# Patient Record
Sex: Female | Born: 1975 | Race: Black or African American | Hispanic: No | Marital: Single | Smoking: Current every day smoker
Health system: Southern US, Community
[De-identification: ages and names within clinical notes are randomized; demographics above are authoritative.]

## PROBLEM LIST (undated history)

## (undated) DIAGNOSIS — R102 Pelvic and perineal pain: Secondary | ICD-10-CM

## (undated) DIAGNOSIS — F1290 Cannabis use, unspecified, uncomplicated: Secondary | ICD-10-CM

## (undated) DIAGNOSIS — C8590 Non-Hodgkin lymphoma, unspecified, unspecified site: Secondary | ICD-10-CM

## (undated) DIAGNOSIS — D219 Benign neoplasm of connective and other soft tissue, unspecified: Secondary | ICD-10-CM

## (undated) DIAGNOSIS — K432 Incisional hernia without obstruction or gangrene: Secondary | ICD-10-CM

## (undated) DIAGNOSIS — F319 Bipolar disorder, unspecified: Secondary | ICD-10-CM

## (undated) DIAGNOSIS — M2550 Pain in unspecified joint: Secondary | ICD-10-CM

## (undated) DIAGNOSIS — M7989 Other specified soft tissue disorders: Secondary | ICD-10-CM

## (undated) DIAGNOSIS — O009 Unspecified ectopic pregnancy without intrauterine pregnancy: Secondary | ICD-10-CM

## (undated) DIAGNOSIS — Z8759 Personal history of other complications of pregnancy, childbirth and the puerperium: Secondary | ICD-10-CM

## (undated) HISTORY — DX: Pain in unspecified joint: M25.50

## (undated) HISTORY — DX: Other specified soft tissue disorders: M79.89

## (undated) HISTORY — DX: Pelvic and perineal pain: R10.2

## (undated) HISTORY — DX: Cannabis use, unspecified, uncomplicated: F12.90

## (undated) HISTORY — DX: Personal history of other complications of pregnancy, childbirth and the puerperium: Z87.59

## (undated) HISTORY — DX: Incisional hernia without obstruction or gangrene: K43.2

## (undated) HISTORY — DX: Non-Hodgkin lymphoma, unspecified, unspecified site: C85.90

## (undated) HISTORY — DX: Bipolar disorder, unspecified: F31.9

---

## 1998-01-14 HISTORY — PX: EXPLORATORY LAPAROTOMY: SUR591

## 2000-02-05 ENCOUNTER — Inpatient Hospital Stay (HOSPITAL_COMMUNITY): Admission: AD | Admit: 2000-02-05 | Discharge: 2000-02-08 | Payer: Self-pay | Admitting: *Deleted

## 2000-02-07 ENCOUNTER — Encounter: Payer: Self-pay | Admitting: Obstetrics & Gynecology

## 2003-09-20 ENCOUNTER — Other Ambulatory Visit: Payer: Self-pay

## 2003-11-08 ENCOUNTER — Emergency Department: Payer: Self-pay | Admitting: Emergency Medicine

## 2004-08-05 ENCOUNTER — Emergency Department: Payer: Self-pay | Admitting: Emergency Medicine

## 2004-08-06 ENCOUNTER — Ambulatory Visit: Payer: Self-pay | Admitting: Emergency Medicine

## 2004-08-22 ENCOUNTER — Emergency Department: Payer: Self-pay | Admitting: Emergency Medicine

## 2004-10-18 ENCOUNTER — Emergency Department: Payer: Self-pay | Admitting: Emergency Medicine

## 2004-11-29 ENCOUNTER — Observation Stay: Payer: Self-pay

## 2005-01-30 ENCOUNTER — Observation Stay: Payer: Self-pay | Admitting: Obstetrics & Gynecology

## 2005-02-01 ENCOUNTER — Ambulatory Visit: Payer: Self-pay

## 2005-02-03 ENCOUNTER — Observation Stay: Payer: Self-pay | Admitting: Obstetrics & Gynecology

## 2005-02-04 ENCOUNTER — Observation Stay: Payer: Self-pay

## 2005-02-07 ENCOUNTER — Observation Stay: Payer: Self-pay | Admitting: Obstetrics and Gynecology

## 2005-02-08 ENCOUNTER — Inpatient Hospital Stay: Payer: Self-pay | Admitting: Unknown Physician Specialty

## 2005-02-12 ENCOUNTER — Inpatient Hospital Stay: Payer: Self-pay | Admitting: Obstetrics & Gynecology

## 2006-01-14 DIAGNOSIS — C8590 Non-Hodgkin lymphoma, unspecified, unspecified site: Secondary | ICD-10-CM

## 2006-01-14 HISTORY — PX: PORTACATH PLACEMENT: SHX2246

## 2006-01-14 HISTORY — DX: Non-Hodgkin lymphoma, unspecified, unspecified site: C85.90

## 2006-12-16 ENCOUNTER — Emergency Department: Payer: Self-pay | Admitting: Emergency Medicine

## 2007-03-28 ENCOUNTER — Emergency Department: Payer: Self-pay | Admitting: Emergency Medicine

## 2007-05-26 ENCOUNTER — Emergency Department: Payer: Self-pay | Admitting: Emergency Medicine

## 2007-07-04 ENCOUNTER — Emergency Department: Payer: Self-pay | Admitting: Emergency Medicine

## 2007-07-06 ENCOUNTER — Emergency Department: Payer: Self-pay | Admitting: Emergency Medicine

## 2007-07-08 ENCOUNTER — Emergency Department: Payer: Self-pay | Admitting: Emergency Medicine

## 2007-08-04 ENCOUNTER — Emergency Department: Payer: Self-pay | Admitting: Emergency Medicine

## 2007-08-08 ENCOUNTER — Ambulatory Visit: Payer: Self-pay | Admitting: Emergency Medicine

## 2007-08-14 ENCOUNTER — Ambulatory Visit: Payer: Self-pay

## 2007-11-22 ENCOUNTER — Emergency Department: Payer: Self-pay | Admitting: Emergency Medicine

## 2007-11-27 ENCOUNTER — Emergency Department: Payer: Self-pay | Admitting: Emergency Medicine

## 2007-12-29 ENCOUNTER — Emergency Department: Payer: Self-pay | Admitting: Emergency Medicine

## 2008-01-11 ENCOUNTER — Emergency Department: Payer: Self-pay | Admitting: Emergency Medicine

## 2008-02-22 ENCOUNTER — Encounter: Payer: Self-pay | Admitting: Obstetrics and Gynecology

## 2008-03-20 ENCOUNTER — Emergency Department: Payer: Self-pay | Admitting: Emergency Medicine

## 2008-05-26 ENCOUNTER — Ambulatory Visit: Payer: Self-pay | Admitting: Unknown Physician Specialty

## 2008-06-06 ENCOUNTER — Observation Stay: Payer: Self-pay

## 2008-06-07 ENCOUNTER — Observation Stay: Payer: Self-pay | Admitting: Obstetrics & Gynecology

## 2008-07-01 ENCOUNTER — Observation Stay: Payer: Self-pay

## 2008-07-04 ENCOUNTER — Observation Stay: Payer: Self-pay

## 2008-07-06 ENCOUNTER — Inpatient Hospital Stay: Payer: Self-pay | Admitting: Obstetrics and Gynecology

## 2009-01-14 HISTORY — PX: ABDOMINAL SURGERY: SHX537

## 2009-04-05 ENCOUNTER — Emergency Department: Payer: Self-pay

## 2009-07-11 ENCOUNTER — Observation Stay: Payer: Self-pay

## 2009-07-12 ENCOUNTER — Ambulatory Visit: Payer: Self-pay | Admitting: Obstetrics and Gynecology

## 2009-07-13 ENCOUNTER — Inpatient Hospital Stay: Payer: Self-pay

## 2010-06-01 NOTE — Discharge Summary (Signed)
[facility]  Patient:    Courtney Webb, Courtney Webb                        MRN: [id number] Adm. Date:  02/05/00 Disc. Date: 02/08/00 Attending:  Conni Elliot, M.D. Dictator:   Jamey Reas, M.D. CC:         [facility] Department; fax #[phone]                           Discharge Summary  DATE OF BIRTH:                April 27, 1975  SERVICE:                      [facility] teaching service.  PROCEDURE:                    None.  HOSPITAL COURSE:              A 35 year old G3, P1-0-1-1 at 74 and 5 by LMP confirmed by ultrasound who went to [facility] on February 05, 2000 with cramping and spotting.  Cervical examination revealed 1 cm external os.  [facility]  Patient had a history of preterm labor with her prior pregnancy, hospitalized x 2 months prior to delivery.  Patient was admitted.  Wet prep on admission showed positive clue cells per [place] with too numerous to count wbcs.  GC, chlamydia as well as group B strep were all done at the time of admission, all of which were subsequently found to be negative.  Patient was admitted, started on IV Unasyn as well as Motrin over the next 72 hours.  She did receive betamethasone 12.5 mg IM x 2 24 hours apart.  Patient remained stable throughout her hospitalization.  She was contracting no further.  Her examination was repeated on February 07, 2000 and found to have loose 1, 60-70%, -2 which was the same as on admission.  She did have a lot of lower uterine segment development.  Patient remained stable throughout the last 24 hours of her hospital course.  She will be discharged to home with followup at the [facility] Department scheduled for February 11, 2000 at 10:40 a.m. She will be discharged on prenatal vitamins.  She was discharged in good condition.  Patient was made aware of this plan.  She will be given preterm labor precautions prior  to discharge. DD:  02/08/00 TD:  02/08/00 Job: [id number]

## 2012-07-24 DIAGNOSIS — F1290 Cannabis use, unspecified, uncomplicated: Secondary | ICD-10-CM

## 2012-07-24 DIAGNOSIS — Z8759 Personal history of other complications of pregnancy, childbirth and the puerperium: Secondary | ICD-10-CM

## 2012-07-24 HISTORY — DX: Cannabis use, unspecified, uncomplicated: F12.90

## 2012-07-24 HISTORY — DX: Personal history of other complications of pregnancy, childbirth and the puerperium: Z87.59

## 2012-08-18 ENCOUNTER — Emergency Department: Payer: Self-pay | Admitting: Emergency Medicine

## 2012-08-18 LAB — WET PREP, GENITAL

## 2012-08-18 LAB — CBC
HCT: 34.2 % — ABNORMAL LOW (ref 35.0–47.0)
HGB: 12 g/dL (ref 12.0–16.0)
MCH: 32.9 pg (ref 26.0–34.0)
MCHC: 35.2 g/dL (ref 32.0–36.0)
MCV: 93 fL (ref 80–100)
Platelet: 236 10*3/uL (ref 150–440)
RBC: 3.66 10*6/uL — ABNORMAL LOW (ref 3.80–5.20)
RDW: 13.7 % (ref 11.5–14.5)
WBC: 9.8 10*3/uL (ref 3.6–11.0)

## 2012-08-18 LAB — LIPASE, BLOOD: Lipase: 179 U/L (ref 73–393)

## 2012-08-18 LAB — URINALYSIS, COMPLETE
Bacteria: NONE SEEN
Bilirubin,UR: NEGATIVE
Blood: NEGATIVE
Glucose,UR: NEGATIVE mg/dL (ref 0–75)
Ketone: NEGATIVE
Leukocyte Esterase: NEGATIVE
Nitrite: NEGATIVE
Ph: 7 (ref 4.5–8.0)
Protein: NEGATIVE
RBC,UR: 1 /HPF (ref 0–5)
Specific Gravity: 1.015 (ref 1.003–1.030)
Squamous Epithelial: 1
WBC UR: <1 /HPF (ref 0–5)

## 2012-08-18 LAB — GC/CHLAMYDIA PROBE AMP

## 2012-08-18 LAB — COMPREHENSIVE METABOLIC PANEL
Albumin: 3.2 g/dL — ABNORMAL LOW (ref 3.4–5.0)
Alkaline Phosphatase: 37 U/L — ABNORMAL LOW (ref 50–136)
Anion Gap: 3 — ABNORMAL LOW (ref 7–16)
BUN: 7 mg/dL (ref 7–18)
Bilirubin,Total: 0.3 mg/dL (ref 0.2–1.0)
Calcium, Total: 9 mg/dL (ref 8.5–10.1)
Chloride: 105 mmol/L (ref 98–107)
Co2: 29 mmol/L (ref 21–32)
Creatinine: 0.6 mg/dL (ref 0.60–1.30)
EGFR (African American): >60
EGFR (Non-African Amer.): >60
Glucose: 105 mg/dL — ABNORMAL HIGH (ref 65–99)
Osmolality: 272 (ref 275–301)
Potassium: 3.6 mmol/L (ref 3.5–5.1)
SGOT(AST): 12 U/L — ABNORMAL LOW (ref 15–37)
SGPT (ALT): 13 U/L (ref 12–78)
Sodium: 137 mmol/L (ref 136–145)
Total Protein: 7.4 g/dL (ref 6.4–8.2)

## 2012-08-18 LAB — HCG, QUANTITATIVE, PREGNANCY: Beta Hcg, Quant.: 27165 m[IU]/mL — ABNORMAL HIGH

## 2013-01-01 DIAGNOSIS — R102 Pelvic and perineal pain: Secondary | ICD-10-CM

## 2013-01-01 HISTORY — DX: Pelvic and perineal pain: R10.2

## 2013-12-02 DIAGNOSIS — M2550 Pain in unspecified joint: Secondary | ICD-10-CM

## 2013-12-02 HISTORY — DX: Pain in unspecified joint: M25.50

## 2013-12-13 DIAGNOSIS — C8590 Non-Hodgkin lymphoma, unspecified, unspecified site: Secondary | ICD-10-CM | POA: Insufficient documentation

## 2013-12-13 HISTORY — DX: Non-Hodgkin lymphoma, unspecified, unspecified site: C85.90

## 2014-05-27 ENCOUNTER — Other Ambulatory Visit: Payer: Self-pay

## 2014-05-27 ENCOUNTER — Emergency Department: Payer: Medicaid Other

## 2014-05-27 ENCOUNTER — Encounter: Payer: Self-pay | Admitting: Emergency Medicine

## 2014-05-27 ENCOUNTER — Emergency Department
Admission: EM | Admit: 2014-05-27 | Discharge: 2014-05-27 | Disposition: A | Discharge status: Home / Self Care | Payer: Medicaid Other | Attending: Emergency Medicine | Admitting: Emergency Medicine

## 2014-05-27 DIAGNOSIS — B9689 Other specified bacterial agents as the cause of diseases classified elsewhere: Secondary | ICD-10-CM

## 2014-05-27 DIAGNOSIS — N730 Acute parametritis and pelvic cellulitis: Secondary | ICD-10-CM | POA: Diagnosis not present

## 2014-05-27 DIAGNOSIS — N760 Acute vaginitis: Secondary | ICD-10-CM | POA: Diagnosis not present

## 2014-05-27 DIAGNOSIS — Z9889 Other specified postprocedural states: Secondary | ICD-10-CM | POA: Diagnosis not present

## 2014-05-27 DIAGNOSIS — Z3202 Encounter for pregnancy test, result negative: Secondary | ICD-10-CM | POA: Insufficient documentation

## 2014-05-27 DIAGNOSIS — N8320 Unspecified ovarian cysts: Secondary | ICD-10-CM | POA: Insufficient documentation

## 2014-05-27 DIAGNOSIS — R1032 Left lower quadrant pain: Secondary | ICD-10-CM | POA: Diagnosis present

## 2014-05-27 DIAGNOSIS — Z720 Tobacco use: Secondary | ICD-10-CM | POA: Insufficient documentation

## 2014-05-27 DIAGNOSIS — N83202 Unspecified ovarian cyst, left side: Secondary | ICD-10-CM

## 2014-05-27 DIAGNOSIS — R52 Pain, unspecified: Secondary | ICD-10-CM

## 2014-05-27 HISTORY — DX: Non-Hodgkin lymphoma, unspecified, unspecified site: C85.90

## 2014-05-27 LAB — CBC WITH DIFFERENTIAL/PLATELET
BASOS ABS: 0.1 10*3/uL (ref 0–0.1)
Basophils Relative: 1 %
Eosinophils Absolute: 0 10*3/uL (ref 0–0.7)
Eosinophils Relative: 0 %
HEMATOCRIT: 41.6 % (ref 35.0–47.0)
HEMOGLOBIN: 13.6 g/dL (ref 12.0–16.0)
LYMPHS PCT: 6 %
Lymphs Abs: 0.9 10*3/uL — ABNORMAL LOW (ref 1.0–3.6)
MCH: 30.2 pg (ref 26.0–34.0)
MCHC: 32.8 g/dL (ref 32.0–36.0)
MCV: 92.1 fL (ref 80.0–100.0)
Monocytes Absolute: 1 10*3/uL — ABNORMAL HIGH (ref 0.2–0.9)
Monocytes Relative: 6 %
NEUTROS ABS: 14.7 10*3/uL — AB (ref 1.4–6.5)
NEUTROS PCT: 87 %
Platelets: 172 10*3/uL (ref 150–440)
RBC: 4.52 MIL/uL (ref 3.80–5.20)
RDW: 14.5 % (ref 11.5–14.5)
WBC: 16.7 10*3/uL — AB (ref 3.6–11.0)

## 2014-05-27 LAB — URINALYSIS COMPLETE WITH MICROSCOPIC (ARMC ONLY)
Bilirubin Urine: NEGATIVE
Glucose, UA: NEGATIVE mg/dL
Leukocytes, UA: NEGATIVE
NITRITE: NEGATIVE
Protein, ur: NEGATIVE mg/dL
Specific Gravity, Urine: 1.021 (ref 1.005–1.030)
pH: 5 (ref 5.0–8.0)

## 2014-05-27 LAB — COMPREHENSIVE METABOLIC PANEL
ALT: 10 U/L — ABNORMAL LOW (ref 14–54)
AST: 14 U/L — ABNORMAL LOW (ref 15–41)
Albumin: 4 g/dL (ref 3.5–5.0)
Alkaline Phosphatase: 42 U/L (ref 38–126)
Anion gap: 10 (ref 5–15)
BUN: 9 mg/dL (ref 6–20)
CO2: 22 mmol/L (ref 22–32)
Calcium: 9.1 mg/dL (ref 8.9–10.3)
Chloride: 101 mmol/L (ref 101–111)
Creatinine, Ser: 0.83 mg/dL (ref 0.44–1.00)
GFR calc Af Amer: >60 mL/min (ref >60)
GFR calc non Af Amer: >60 mL/min (ref >60)
GLUCOSE: 105 mg/dL — AB (ref 65–99)
Potassium: 3.6 mmol/L (ref 3.5–5.1)
Sodium: 133 mmol/L — ABNORMAL LOW (ref 135–145)
Total Bilirubin: 0.7 mg/dL (ref 0.3–1.2)
Total Protein: 8.3 g/dL — ABNORMAL HIGH (ref 6.5–8.1)

## 2014-05-27 LAB — WET PREP, GENITAL
Trich, Wet Prep: NONE SEEN
YEAST WET PREP: NONE SEEN

## 2014-05-27 LAB — CHLAMYDIA/NGC RT PCR (ARMC ONLY)
Chlamydia Tr: NOT DETECTED
N GONORRHOEAE: NOT DETECTED

## 2014-05-27 LAB — LIPASE, BLOOD: LIPASE: 30 U/L (ref 22–51)

## 2014-05-27 LAB — POCT PREGNANCY, URINE: PREG TEST UR: NEGATIVE

## 2014-05-27 MED ORDER — FENTANYL CITRATE (PF) 100 MCG/2ML IJ SOLN
INTRAMUSCULAR | Status: AC
  Filled 2014-05-27: qty 2

## 2014-05-27 MED ORDER — IBUPROFEN 600 MG PO TABS: 600.0000 mg | ORAL_TABLET | Freq: Four times a day (QID) | ORAL | Status: AC | PRN

## 2014-05-27 MED ORDER — HYDROCODONE-ACETAMINOPHEN 5-325 MG PO TABS: 1.0000 | ORAL_TABLET | ORAL | Status: DC | PRN

## 2014-05-27 MED ORDER — DEXTROSE 5 % IV SOLN
INTRAVENOUS | Status: AC
  Administered 2014-05-27: 1 g via INTRAVENOUS
  Filled 2014-05-27: qty 10

## 2014-05-27 MED ORDER — DOXYCYCLINE HYCLATE 100 MG PO TABS: 100.0000 mg | ORAL_TABLET | Freq: Two times a day (BID) | ORAL | Status: DC

## 2014-05-27 MED ORDER — MORPHINE SULFATE 4 MG/ML IJ SOLN
INTRAMUSCULAR | Status: AC
  Administered 2014-05-27: 4 mg via INTRAVENOUS
  Filled 2014-05-27: qty 1

## 2014-05-27 MED ORDER — DEXTROSE 5 % IV SOLN
1.0000 g | Freq: Once | INTRAVENOUS | Status: AC
  Administered 2014-05-27: 1 g via INTRAVENOUS

## 2014-05-27 MED ORDER — SODIUM CHLORIDE 0.9 % IV BOLUS (SEPSIS)
1000.0000 mL | Freq: Once | INTRAVENOUS | Status: AC
  Administered 2014-05-27: 1000 mL via INTRAVENOUS

## 2014-05-27 MED ORDER — DOXYCYCLINE HYCLATE 100 MG PO TABS
100.0000 mg | ORAL_TABLET | Freq: Once | ORAL | Status: AC
  Administered 2014-05-27: 100 mg via ORAL
  Filled 2014-05-27 (×2): qty 1

## 2014-05-27 MED ORDER — MORPHINE SULFATE 4 MG/ML IJ SOLN
4.0000 mg | Freq: Once | INTRAMUSCULAR | Status: AC
  Administered 2014-05-27: 4 mg via INTRAVENOUS

## 2014-05-27 MED ORDER — ONDANSETRON HCL 4 MG/2ML IJ SOLN
INTRAMUSCULAR | Status: AC
  Administered 2014-05-27: 4 mg via INTRAVENOUS
  Filled 2014-05-27: qty 2

## 2014-05-27 MED ORDER — ONDANSETRON HCL 4 MG/2ML IJ SOLN
4.0000 mg | Freq: Once | INTRAMUSCULAR | Status: AC
  Administered 2014-05-27: 4 mg via INTRAVENOUS

## 2014-05-27 MED ORDER — FENTANYL CITRATE (PF) 100 MCG/2ML IJ SOLN
50.0000 ug | Freq: Once | INTRAMUSCULAR | Status: AC
  Administered 2014-05-27: 50 ug via INTRAVENOUS

## 2014-05-27 NOTE — ED Provider Notes (Signed)
[facility] Emergency Department Provider Note  Time seen: 11:05 AM  I have reviewed the triage vital signs and the nursing notes.   HISTORY  Chief Complaint Abdominal Pain    HPI Courtney Webb is a 39 y.o. female with a past medical history of non-Hodgkin's lymphoma in remission, who presents the emergency department with 2 days of left-sided abdominal pain and nausea. Patient also states 2-3 weeks of thick white vaginal discharge and pain with intercourse. Patient feels like she has had a fever since yesterday. Positive nausea, but denies vomiting. Denies diarrhea but states a normal bowel movement yesterday. Denies dysuria but states a foul smell to her urine. Patient describes the pain as a dull constant 10/10 pain mostly in the left lower quadrant but somewhat over the entire lower abdomen.    Past Medical History  Diagnosis Date  . Non Hodgkin's lymphoma 2008    There are no active problems to display for this patient.   Past Surgical History  Procedure Laterality Date  . Abdominal surgery    . Portacath placement      No current outpatient prescriptions on file.  Allergies Review of patient's allergies indicates no known allergies.  No family history on file.  Social History History  Substance Use Topics  . Smoking status: Current Every Day Smoker  . Smokeless tobacco: Not on file  . Alcohol Use: No    Review of Systems Constitutional: Positive for subjective fever ENT: Negative for cough or congestion. Cardiovascular: Negative for chest pain. Respiratory: Negative for shortness of breath. Gastrointestinal: Positive for left-sided abdominal pain. Negative for vomiting or diarrhea. Positive for nausea. Genitourinary: Negative for dysuria. Positive for foul urine odor. Musculoskeletal: Negative for back pain.  10-point ROS otherwise negative.  ____________________________________________   PHYSICAL EXAM:  VITAL SIGNS: ED  Triage Vitals  Enc Vitals Group     BP 05/27/14 0936 146/98 mmHg     Pulse Rate 05/27/14 0936 97     Resp 05/27/14 0936 20     Temp 05/27/14 0936 100.2 F (37.9 C)     Temp Source 05/27/14 0936 Oral     SpO2 05/27/14 0936 98 %     Weight 05/27/14 0936 156 lb (70.761 kg)     Height 05/27/14 0936 5\' 3"  (1.6 m)     Head Cir --      Peak Flow --      Pain Score --      Pain Loc --      Pain Edu? --      Excl. in [place]? --     Constitutional: Alert and oriented. Well appearing and in no distress. Eyes: Normal exam Cardiovascular: Normal rate, regular rhythm. No murmurs, rubs, or gallops. Respiratory: Normal respiratory effort without tachypnea nor retractions. Breath sounds are clear and equal bilaterally. No wheezes/rales/rhonchi. Gastrointestinal: Soft, mild/moderate left lower quadrant tenderness to palpation, mild suprapubic tenderness to palpation. No rebound or guarding. Genitourinary: Moderate white/thick discharge.  Moderate left adnexal Tenderness.  Moderate Cervical motion tenderness. Musculoskeletal: Nontender with normal range of motion in all extremities Neurologic:  Normal speech and language.  Skin:  Skin is warm, dry and intact.  Psychiatric: Mood and affect are normal. Speech and behavior are normal.   ____________________________________________    EKG  EKG shows normal sinus rhythm at 99 bpm, narrow QRS, normal axis, normal intervals, no ST changes.  ____________________________________________   INITIAL IMPRESSION / ASSESSMENT AND PLAN / ED COURSE  Pertinent labs &  imaging results that were available during my care of the patient were reviewed by me and considered in my medical decision making (see chart for details).  Patient has a borderline temperature of 100.2. Along with white vaginal discharge in left lower quadrant/pelvic tenderness, I will perform a pelvic exam to further evaluate. We'll check blood work and urinalysis. We'll treat pain and nausea, and  closely monitor in the emergency department.  ----------------------------------------- 2:52 PM on 05/27/2014 -----------------------------------------  CT shows left ovarian cyst approximate 4.5 cm. Ultrasound shows 2 ovarian cysts possibly complex. I discussed these findings with the on-call gynecologist Dr. Enzo Bi, who recommends doxycycline, pain medication and follow-up in the office on Tuesday with him. I discussed this with the patient along with very strict return precautions and she is agreeable to this plan.  ____________________________________________   FINAL CLINICAL IMPRESSION(S) / ED DIAGNOSES  Abdominal pain Ovarian Cysts Pelvic Inflammatory disease Bacterial vaginosis.   Harvest Dark, MD 05/27/14 [phone]

## 2014-05-27 NOTE — ED Notes (Signed)
Pt returned from US

## 2014-05-27 NOTE — Discharge Instructions (Signed)
Abdominal Pain Many things can cause belly (abdominal) pain. Most times, the belly pain is not dangerous. Many cases of belly pain can be watched and treated at home. HOME CARE   Do not take medicines that help you go poop (laxatives) unless told to by your doctor.  Only take medicine as told by your doctor.  Eat or drink as told by your doctor. Your doctor will tell you if you should be on a special diet. GET HELP IF:  You do not know what is causing your belly pain.  You have belly pain while you are sick to your stomach (nauseous) or have runny poop (diarrhea).  You have pain while you pee or poop.  Your belly pain wakes you up at night.  You have belly pain that gets worse or better when you eat.  You have belly pain that gets worse when you eat fatty foods.  You have a fever. GET HELP RIGHT AWAY IF:   The pain does not go away within 2 hours.  You keep throwing up (vomiting).  The pain changes and is only in the right or left part of the belly.  You have bloody or tarry looking poop. MAKE SURE YOU:   Understand these instructions.  Will watch your condition.  Will get help right away if you are not doing well or get worse. Document Released: 06/19/2007 Document Revised: 01/05/2013 Document Reviewed: 09/09/2012 [facility] Patient Information 2015 [place], [place]. This information is not intended to replace advice given to you by your health care provider. Make sure you discuss any questions you have with your health care provider.  Pelvic Inflammatory Disease Pelvic inflammatory disease (PID) is an infection in some or all of the female organs. PID can be in the uterus, ovaries, fallopian tubes, or the surrounding tissues inside the lower belly area (pelvis). HOME CARE   If given, take your antibiotic medicine as told. Finish them even if you start to feel better.  Only take medicine as told by your doctor.  Do not have sex (intercourse) until treatment is done or  as told by your doctor.  Tell your sex partner if you have PID. Your partner may need to be treated.  Keep all doctor visits. GET HELP RIGHT AWAY IF:   You have a fever.  You have more belly (abdominal) or lower belly pain.  You have chills.  You have pain when you pee (urinate).  You are not better after 72 hours.  You have more fluid (discharge) coming from your vagina or fluid that is not normal.  You need pain medicine from your doctor.  You throw up (vomit).  You cannot take your medicines.  Your partner has a sexually transmitted disease (STD). MAKE SURE YOU:   Understand these instructions.  Will watch your condition.  Will get help right away if you are not doing well or get worse. Document Released: 03/29/2008 Document Revised: 04/27/2012 Document Reviewed: 12/27/2010 [facility] Patient Information 2015 [place], [place]. This information is not intended to replace advice given to you by your health care provider. Make sure you discuss any questions you have with your health care provider.    Please call the number provided for gynecology for follow-up on Tuesday as we discussed. Please return to the emergency department with any worsening pain, fever, vomiting. Take your medications as prescribed.

## 2014-05-27 NOTE — ED Notes (Signed)
Pt reports LLQ pain, radiating to her back Pt reports nausea denies vomiting.

## 2014-05-27 NOTE — ED Notes (Addendum)
Patient c/o sharp LLQ pain since yesterday. States that pain extends down her legs and up to her chest. Admits to some nausea. White, fishy-smelling vaginal discharge. Denies any vaginal itching. Patient tearful and fidgety in triage.

## 2014-05-27 NOTE — ED Notes (Signed)
Pt transported to US

## 2014-05-29 ENCOUNTER — Emergency Department
Admission: EM | Admit: 2014-05-29 | Discharge: 2014-05-29 | Disposition: A | Discharge status: Home / Self Care | Payer: Medicaid Other | Attending: Emergency Medicine | Admitting: Emergency Medicine

## 2014-05-29 ENCOUNTER — Encounter: Payer: Self-pay | Admitting: Emergency Medicine

## 2014-05-29 DIAGNOSIS — R799 Abnormal finding of blood chemistry, unspecified: Secondary | ICD-10-CM | POA: Diagnosis present

## 2014-05-29 HISTORY — DX: Unspecified ectopic pregnancy without intrauterine pregnancy: O00.90

## 2014-05-29 NOTE — ED Notes (Signed)
Spoke with MD Edd Fabian, stated Beta was done 2 years ago. Spoke with pt, stated she would f/u with GYN. Pt voiced no concerns or questions.

## 2014-05-29 NOTE — ED Notes (Signed)
Pt presents to ER stating she was seen yesterday. Pt reports she was d/c and told to f/u with GYN for PID. Pt has been taking antibiotics, but reports when she check her MyChart saw her HCG level was raised. Pt had CT and US done 2 days ago that were neg for pregnancy.

## 2014-12-11 ENCOUNTER — Encounter: Payer: Self-pay | Admitting: Emergency Medicine

## 2014-12-11 ENCOUNTER — Emergency Department: Payer: Medicaid Other

## 2014-12-11 ENCOUNTER — Emergency Department
Admission: EM | Admit: 2014-12-11 | Discharge: 2014-12-11 | Disposition: A | Discharge status: Home / Self Care | Payer: Medicaid Other | Attending: Emergency Medicine | Admitting: Emergency Medicine

## 2014-12-11 DIAGNOSIS — N83201 Unspecified ovarian cyst, right side: Secondary | ICD-10-CM | POA: Diagnosis not present

## 2014-12-11 DIAGNOSIS — F17200 Nicotine dependence, unspecified, uncomplicated: Secondary | ICD-10-CM | POA: Diagnosis not present

## 2014-12-11 DIAGNOSIS — R1031 Right lower quadrant pain: Secondary | ICD-10-CM | POA: Diagnosis present

## 2014-12-11 DIAGNOSIS — N760 Acute vaginitis: Secondary | ICD-10-CM | POA: Insufficient documentation

## 2014-12-11 DIAGNOSIS — B9689 Other specified bacterial agents as the cause of diseases classified elsewhere: Secondary | ICD-10-CM

## 2014-12-11 DIAGNOSIS — R102 Pelvic and perineal pain: Secondary | ICD-10-CM

## 2014-12-11 DIAGNOSIS — Z3202 Encounter for pregnancy test, result negative: Secondary | ICD-10-CM | POA: Insufficient documentation

## 2014-12-11 DIAGNOSIS — Z792 Long term (current) use of antibiotics: Secondary | ICD-10-CM | POA: Diagnosis not present

## 2014-12-11 LAB — COMPREHENSIVE METABOLIC PANEL
ALBUMIN: 3.8 g/dL (ref 3.5–5.0)
ALT: 8 U/L — ABNORMAL LOW (ref 14–54)
ANION GAP: 4 — AB (ref 5–15)
AST: 14 U/L — ABNORMAL LOW (ref 15–41)
Alkaline Phosphatase: 36 U/L — ABNORMAL LOW (ref 38–126)
BILIRUBIN TOTAL: 0.4 mg/dL (ref 0.3–1.2)
BUN: 10 mg/dL (ref 6–20)
CO2: 28 mmol/L (ref 22–32)
Calcium: 8.9 mg/dL (ref 8.9–10.3)
Chloride: 106 mmol/L (ref 101–111)
Creatinine, Ser: 0.7 mg/dL (ref 0.44–1.00)
GFR calc Af Amer: >60 mL/min (ref >60)
Glucose, Bld: 95 mg/dL (ref 65–99)
POTASSIUM: 3.8 mmol/L (ref 3.5–5.1)
Sodium: 138 mmol/L (ref 135–145)
TOTAL PROTEIN: 7.7 g/dL (ref 6.5–8.1)

## 2014-12-11 LAB — URINALYSIS COMPLETE WITH MICROSCOPIC (ARMC ONLY)
Bacteria, UA: NONE SEEN
Bilirubin Urine: NEGATIVE
GLUCOSE, UA: NEGATIVE mg/dL
Hgb urine dipstick: NEGATIVE
KETONES UR: NEGATIVE mg/dL
Leukocytes, UA: NEGATIVE
NITRITE: NEGATIVE
PROTEIN: NEGATIVE mg/dL
SPECIFIC GRAVITY, URINE: 1.023 (ref 1.005–1.030)
pH: 7 (ref 5.0–8.0)

## 2014-12-11 LAB — CBC
HCT: 42.7 % (ref 35.0–47.0)
HEMOGLOBIN: 13.8 g/dL (ref 12.0–16.0)
MCH: 30.9 pg (ref 26.0–34.0)
MCHC: 32.4 g/dL (ref 32.0–36.0)
MCV: 95.6 fL (ref 80.0–100.0)
Platelets: 204 10*3/uL (ref 150–440)
RBC: 4.47 MIL/uL (ref 3.80–5.20)
RDW: 15 % — ABNORMAL HIGH (ref 11.5–14.5)
WBC: 6.3 10*3/uL (ref 3.6–11.0)

## 2014-12-11 LAB — WET PREP, GENITAL
Sperm: NONE SEEN
Trich, Wet Prep: NONE SEEN
YEAST WET PREP: NONE SEEN

## 2014-12-11 LAB — CHLAMYDIA/NGC RT PCR (ARMC ONLY)
Chlamydia Tr: NOT DETECTED
N GONORRHOEAE: NOT DETECTED

## 2014-12-11 LAB — LIPASE, BLOOD: Lipase: 44 U/L (ref 11–51)

## 2014-12-11 LAB — POCT PREGNANCY, URINE: Preg Test, Ur: NEGATIVE

## 2014-12-11 MED ORDER — PROMETHAZINE HCL 25 MG PO TABS: 25.0000 mg | ORAL_TABLET | Freq: Four times a day (QID) | ORAL | Status: DC | PRN

## 2014-12-11 MED ORDER — OXYCODONE-ACETAMINOPHEN 5-325 MG PO TABS: 1.0000 | ORAL_TABLET | Freq: Four times a day (QID) | ORAL | Status: DC | PRN

## 2014-12-11 MED ORDER — IOHEXOL 300 MG/ML  SOLN
75.0000 mL | Freq: Once | INTRAMUSCULAR | Status: AC | PRN
  Administered 2014-12-11: 75 mL via INTRAVENOUS

## 2014-12-11 MED ORDER — NAPROXEN 500 MG PO TABS: 500.0000 mg | ORAL_TABLET | Freq: Two times a day (BID) | ORAL | Status: DC

## 2014-12-11 MED ORDER — METRONIDAZOLE 500 MG PO TABS: 500.0000 mg | ORAL_TABLET | Freq: Three times a day (TID) | ORAL | Status: DC

## 2014-12-11 MED ORDER — IOHEXOL 240 MG/ML SOLN
25.0000 mL | Freq: Once | INTRAMUSCULAR | Status: AC | PRN
  Administered 2014-12-11: 25 mL via ORAL

## 2014-12-11 NOTE — ED Notes (Signed)
Pt presents to ER with right lower abdominal pain that started yesterday as cramping around umbilicus and woke up this am with worsening pain that has radiated to right lower quadrant " I can barely move". Pt describes pain as pulling and constant. Pt has taken ibuprofen (last night) only without relief. Pt states that "moving a lot" makes it worse. Alert and oriented with no acute distress.

## 2014-12-11 NOTE — Discharge Instructions (Signed)
You were prescribed a medication that is potentially sedating. Do not drink alcohol, drive or participate in any other potentially dangerous activities while taking this medication as it may make you sleepy. Do not take this medication with any other sedating medications, either prescription or over-the-counter. If you were prescribed Percocet or Vicodin, do not take these with acetaminophen (Tylenol) as it is already contained within these medications.   Opioid pain medications (or "narcotics") can be habit forming.  Use it as little as possible to achieve adequate pain control.  Do not use or use it with extreme caution if you have a history of opiate abuse or dependence.  If you are on a pain contract with your primary care doctor or a pain specialist, be sure to let them know you were prescribed this medication today from the [facility].  This medication is intended for your use only - do not give any to anyone else and keep it in a secure place where nobody else, especially children and pets, have access to it.  It will also cause or worsen constipation, so you may want to consider taking an over-the-counter stool softener while you are taking this medication.  Bacterial Vaginosis Bacterial vaginosis is a vaginal infection that occurs when the normal balance of bacteria in the vagina is disrupted. It results from an overgrowth of certain bacteria. This is the most common vaginal infection in women of childbearing age. Treatment is important to prevent complications, especially in pregnant women, as it can cause a premature delivery. CAUSES  Bacterial vaginosis is caused by an increase in harmful bacteria that are normally present in smaller amounts in the vagina. Several different kinds of bacteria can cause bacterial vaginosis. However, the reason that the condition develops is not fully understood. RISK FACTORS Certain activities or behaviors can put you at an increased  risk of developing bacterial vaginosis, including:  Having a new sex partner or multiple sex partners.  Douching.  Using an intrauterine device (IUD) for contraception. Women do not get bacterial vaginosis from toilet seats, bedding, swimming pools, or contact with objects around them. SIGNS AND SYMPTOMS  Some women with bacterial vaginosis have no signs or symptoms. Common symptoms include:  Grey vaginal discharge.  A fishlike odor with discharge, especially after sexual intercourse.  Itching or burning of the vagina and vulva.  Burning or pain with urination. DIAGNOSIS  Your health care provider will take a medical history and examine the vagina for signs of bacterial vaginosis. A sample of vaginal fluid may be taken. Your health care provider will look at this sample under a microscope to check for bacteria and abnormal cells. A vaginal pH test may also be done.  TREATMENT  Bacterial vaginosis may be treated with antibiotic medicines. These may be given in the form of a pill or a vaginal cream. A second round of antibiotics may be prescribed if the condition comes back after treatment. Because bacterial vaginosis increases your risk for sexually transmitted diseases, getting treated can help reduce your risk for chlamydia, gonorrhea, HIV, and herpes. HOME CARE INSTRUCTIONS   Only take over-the-counter or prescription medicines as directed by your health care provider.  If antibiotic medicine was prescribed, take it as directed. Make sure you finish it even if you start to feel better.  Tell all sexual partners that you have a vaginal infection. They should see their health care provider and be treated if they have problems, such as a mild rash or itching.  During treatment, it is important that you follow these instructions:  Avoid sexual activity or use condoms correctly.  Do not douche.  Avoid alcohol as directed by your health care provider.  Avoid breastfeeding as  directed by your health care provider. SEEK MEDICAL CARE IF:   Your symptoms are not improving after 3 days of treatment.  You have increased discharge or pain.  You have a fever. MAKE SURE YOU:   Understand these instructions.  Will watch your condition.  Will get help right away if you are not doing well or get worse. FOR MORE INFORMATION  Centers for Disease Control and Prevention, Division of STD Prevention: [employer] American Sexual Health Association (ASHA): www.ashastd.org    This information is not intended to replace advice given to you by your health care provider. Make sure you discuss any questions you have with your health care provider.   Document Released: 12/31/2004 Document Revised: 01/21/2014 Document Reviewed: 08/12/2012 Elsevier Interactive Patient Education 2016 [employer] Inc.  Pelvic Pain, Female Female pelvic pain can be caused by many different things and start from a variety of places. Pelvic pain refers to pain that is located in the lower half of the abdomen and between your hips. The pain may occur over a short period of time (acute) or may be reoccurring (chronic). The cause of pelvic pain may be related to disorders affecting the female reproductive organs (gynecologic), but it may also be related to the bladder, kidney stones, an intestinal complication, or muscle or skeletal problems. Getting help right away for pelvic pain is important, especially if there has been severe, sharp, or a sudden onset of unusual pain. It is also important to get help right away because some types of pelvic pain can be life threatening.  CAUSES  Below are only some of the causes of pelvic pain. The causes of pelvic pain can be in one of several categories.   Gynecologic.  Pelvic inflammatory disease.  Sexually transmitted infection.  Ovarian cyst or a twisted ovarian ligament (ovarian torsion).  Uterine lining that grows outside the uterus  (endometriosis).  Fibroids, cysts, or tumors.  Ovulation.  Pregnancy.  Pregnancy that occurs outside the uterus (ectopic pregnancy).  Miscarriage.  Labor.  Abruption of the placenta or ruptured uterus.  Infection.  Uterine infection (endometritis).  Bladder infection.  Diverticulitis.  Miscarriage related to a uterine infection (septic abortion).  Bladder.  Inflammation of the bladder (cystitis).  Kidney stone(s).  Gastrointestinal.  Constipation.  Diverticulitis.  Neurologic.  Trauma.  Feeling pelvic pain because of mental or emotional causes (psychosomatic).  Cancers of the bowel or pelvis. EVALUATION  Your caregiver will want to take a careful history of your concerns. This includes recent changes in your health, a careful gynecologic history of your periods (menses), and a sexual history. Obtaining your family history and medical history is also important. Your caregiver may suggest a pelvic exam. A pelvic exam will help identify the location and severity of the pain. It also helps in the evaluation of which organ system may be involved. In order to identify the cause of the pelvic pain and be properly treated, your caregiver may order tests. These tests may include:   A pregnancy test.  Pelvic ultrasonography.  An X-ray exam of the abdomen.  A urinalysis or evaluation of vaginal discharge.  Blood tests. HOME CARE INSTRUCTIONS   Only take over-the-counter or prescription medicines for pain, discomfort, or fever as directed by your caregiver.   Rest as directed by  your caregiver.   Eat a balanced diet.   Drink enough fluids to make your urine clear or pale yellow, or as directed.   Avoid sexual intercourse if it causes pain.   Apply warm or cold compresses to the lower abdomen depending on which one helps the pain.   Avoid stressful situations.   Keep a journal of your pelvic pain. Write down when it started, where the pain is  located, and if there are things that seem to be associated with the pain, such as food or your menstrual cycle.  Follow up with your caregiver as directed.  SEEK MEDICAL CARE IF:  Your medicine does not help your pain.  You have abnormal vaginal discharge. SEEK IMMEDIATE MEDICAL CARE IF:   You have heavy bleeding from the vagina.   Your pelvic pain increases.   You feel light-headed or faint.   You have chills.   You have pain with urination or blood in your urine.   You have uncontrolled diarrhea or vomiting.   You have a fever or persistent symptoms for more than 3 days.  You have a fever and your symptoms suddenly get worse.   You are being physically or sexually abused.   This information is not intended to replace advice given to you by your health care provider. Make sure you discuss any questions you have with your health care provider.   Document Released: 11/28/2003 Document Revised: 09/21/2014 Document Reviewed: 04/22/2011 Elsevier Interactive Patient Education [employer].

## 2014-12-11 NOTE — ED Notes (Signed)
Pt states upon return from ultrasound she is having some cramping and vaginal spotting.

## 2014-12-11 NOTE — ED Provider Notes (Signed)
[facility] Emergency Department Provider Note  ____________________________________________  Time seen: 1:10 PM  I have reviewed the triage vital signs and the nursing notes.   HISTORY  Chief Complaint Abdominal Pain    HPI Courtney Webb is a 39 y.o. female who presents with right lower quadrant pain. She reports she had abdominal pain around the umbilicus yesterday but today is localized to the right lower quadrant. She reports is painful to move. She denies nausea or vomiting. No fevers or chills. No diarrhea. No trauma to the area. She reports the pain is sharp and crampy and moderate to severe. Nothing seems to make it better except holding still     Past Medical History  Diagnosis Date  . Non Hodgkin's lymphoma ([place]) 2008  . Ectopic pregnancy   . Non Hodgkin's lymphoma ([place])     There are no active problems to display for this patient.   Past Surgical History  Procedure Laterality Date  . Abdominal surgery    . Portacath placement      Current Outpatient Rx  Name  Route  Sig  Dispense  Refill  . doxycycline (VIBRA-TABS) 100 MG tablet   Oral   Take 1 tablet (100 mg total) by mouth 2 (two) times daily.   20 tablet   0   . HYDROcodone-acetaminophen (NORCO/VICODIN) 5-325 MG per tablet   Oral   Take 1 tablet by mouth every 4 (four) hours as needed for moderate pain.   20 tablet   0   . ibuprofen (ADVIL,MOTRIN) 600 MG tablet   Oral   Take 1 tablet (600 mg total) by mouth every 6 (six) hours as needed for moderate pain.   100 tablet   2     Allergies Review of patient's allergies indicates no known allergies.  History reviewed. No pertinent family history.  Social History Social History  Substance Use Topics  . Smoking status: Current Every Day Smoker  . Smokeless tobacco: None  . Alcohol Use: No    Review of Systems  Constitutional: Negative for fever. Eyes: Negative for visual changes. ENT: Negative for sore  throat Cardiovascular: Negative for chest pain. Respiratory: Negative for shortness of breath. Gastrointestinal: Positive for abdominal pain Genitourinary: Negative for dysuria. Musculoskeletal: Negative for back pain. Skin: Negative for rash. Neurological: Negative for headaches or focal weakness Psychiatric: No anxiety    ____________________________________________   PHYSICAL EXAM:  VITAL SIGNS: ED Triage Vitals  Enc Vitals Group     BP 12/11/14 1115 142/95 mmHg     Pulse Rate 12/11/14 1115 77     Resp 12/11/14 1115 18     Temp 12/11/14 1115 97.6 F (36.4 C)     Temp Source 12/11/14 1115 Oral     SpO2 12/11/14 1115 99 %     Weight 12/11/14 1115 161 lb (73.029 kg)     Height 12/11/14 1115 5\' 3"  (1.6 m)     Head Cir --      Peak Flow --      Pain Score 12/11/14 1115 8     Pain Loc --      Pain Edu? --      Excl. in [place]? --      Constitutional: Alert and oriented. Well appearing and in no distress. Eyes: Conjunctivae are normal.  ENT   Head: Normocephalic and atraumatic.   Mouth/Throat: Mucous membranes are moist. Cardiovascular: Normal rate, regular rhythm. Normal and symmetric distal pulses are present in all extremities. No  murmurs, rubs, or gallops. Respiratory: Normal respiratory effort without tachypnea nor retractions. Breath sounds are clear and equal bilaterally.  Gastrointestinal: Mild right lower quadrant tenderness to palpation. No distention. There is no CVA tenderness. Genitourinary: white discharge on exam, no CMT, Musculoskeletal: Nontender with normal range of motion in all extremities. No lower extremity tenderness nor edema. Neurologic:  Normal speech and language. No gross focal neurologic deficits are appreciated. Skin:  Skin is warm, dry and intact. No rash noted. Psychiatric: Mood and affect are normal. Patient exhibits appropriate insight and judgment.  ____________________________________________    LABS (pertinent  positives/negatives)  Labs Reviewed  COMPREHENSIVE METABOLIC PANEL - Abnormal; Notable for the following:    AST 14 (*)    ALT 8 (*)    Alkaline Phosphatase 36 (*)    Anion gap 4 (*)    All other components within normal limits  CBC - Abnormal; Notable for the following:    RDW 15.0 (*)    All other components within normal limits  LIPASE, BLOOD  URINALYSIS COMPLETEWITH MICROSCOPIC ([facility] ONLY)  POC URINE PREG, ED    ____________________________________________   EKG  None  ____________________________________________    RADIOLOGY I have personally reviewed any xrays that were ordered on this patient: CT scan unremarkable  Ultrasound pending  ____________________________________________   PROCEDURES  Procedure(s) performed: none  Critical Care performed: none  ____________________________________________   INITIAL IMPRESSION / ASSESSMENT AND PLAN / ED COURSE  Pertinent labs & imaging results that were available during my care of the patient were reviewed by me and considered in my medical decision making (see chart for details).  Patient's labs are unremarkable, pregnancy is pending. Mild tenderness to palpation right lower quadrant, we'll obtain CT abdomen and pelvis to reevaluate  Pelvic exam with white discharge.  At this point I will sign out to Dr. Joni Fears to follow up on the wet prep, and pelvic ultrasound. If unremarkable patient will be appropriate for discharge  ____________________________________________   FINAL CLINICAL IMPRESSION(S) / ED DIAGNOSES  Final diagnoses:  Pelvic pain in female  Pelvic pain in female     Lavonia Drafts, MD 12/11/14 1540

## 2014-12-11 NOTE — ED Provider Notes (Signed)
-----------------------------------------   7:03 PM on 12/11/2014 -----------------------------------------  Ultrasound reveals no torsion, but complex right ovarian cyst possibly leaking. She also has bacterial vaginosis with clue cells on wet prep. We'll give patient Percocet and Phenergan naproxen and Flagyl. Counseled to follow up with GYN if her symptoms persist or return to emergency Department if she has sudden severe pelvic pain due to risk of torsion.  Carrie Mew, MD 12/11/14 [phone]

## 2014-12-11 NOTE — ED Notes (Signed)
Patient with no complaints at this time. Respirations even and unlabored. Skin warm/dry. Discharge instructions reviewed with patient at this time. Patient given opportunity to voice concerns/ask questions. IV removed per policy and band-aid applied to site. Patient discharged at this time and left Emergency Department with steady gait.  

## 2014-12-19 DIAGNOSIS — N938 Other specified abnormal uterine and vaginal bleeding: Secondary | ICD-10-CM | POA: Diagnosis not present

## 2014-12-19 DIAGNOSIS — D259 Leiomyoma of uterus, unspecified: Secondary | ICD-10-CM | POA: Diagnosis not present

## 2014-12-19 DIAGNOSIS — Z79899 Other long term (current) drug therapy: Secondary | ICD-10-CM | POA: Diagnosis not present

## 2014-12-19 DIAGNOSIS — Z792 Long term (current) use of antibiotics: Secondary | ICD-10-CM | POA: Insufficient documentation

## 2014-12-19 DIAGNOSIS — Z3202 Encounter for pregnancy test, result negative: Secondary | ICD-10-CM | POA: Insufficient documentation

## 2014-12-19 DIAGNOSIS — N939 Abnormal uterine and vaginal bleeding, unspecified: Secondary | ICD-10-CM | POA: Diagnosis present

## 2014-12-19 DIAGNOSIS — Z791 Long term (current) use of non-steroidal anti-inflammatories (NSAID): Secondary | ICD-10-CM | POA: Insufficient documentation

## 2014-12-19 DIAGNOSIS — F17200 Nicotine dependence, unspecified, uncomplicated: Secondary | ICD-10-CM | POA: Insufficient documentation

## 2014-12-19 LAB — COMPREHENSIVE METABOLIC PANEL
ALBUMIN: 3.9 g/dL (ref 3.5–5.0)
ALT: 8 U/L — ABNORMAL LOW (ref 14–54)
AST: 13 U/L — AB (ref 15–41)
Alkaline Phosphatase: 41 U/L (ref 38–126)
Anion gap: 5 (ref 5–15)
BILIRUBIN TOTAL: 0.2 mg/dL — AB (ref 0.3–1.2)
BUN: 10 mg/dL (ref 6–20)
CO2: 32 mmol/L (ref 22–32)
Calcium: 9.4 mg/dL (ref 8.9–10.3)
Chloride: 104 mmol/L (ref 101–111)
Creatinine, Ser: 0.64 mg/dL (ref 0.44–1.00)
GFR calc Af Amer: >60 mL/min (ref >60)
GFR calc non Af Amer: >60 mL/min (ref >60)
GLUCOSE: 89 mg/dL (ref 65–99)
POTASSIUM: 3.6 mmol/L (ref 3.5–5.1)
SODIUM: 141 mmol/L (ref 135–145)
TOTAL PROTEIN: 7.8 g/dL (ref 6.5–8.1)

## 2014-12-19 LAB — HEMOGLOBIN AND HEMATOCRIT, BLOOD
HCT: 37.5 % (ref 35.0–47.0)
Hemoglobin: 12.3 g/dL (ref 12.0–16.0)

## 2014-12-19 LAB — POCT PREGNANCY, URINE: PREG TEST UR: NEGATIVE

## 2014-12-19 NOTE — ED Notes (Signed)
Patient to ED for vaginal bleeding. Was seen here last Sunday for abdominal pain and states after the ultrasound she started having vaginal bleeding and has not stopped since. Is using two tampons per hour. No really cramping "Just a pulling sensation." Patient states she is feeling tired often.

## 2014-12-20 ENCOUNTER — Emergency Department
Admission: EM | Admit: 2014-12-20 | Discharge: 2014-12-20 | Disposition: A | Discharge status: Home / Self Care | Payer: Medicaid Other | Attending: Emergency Medicine | Admitting: Emergency Medicine

## 2014-12-20 ENCOUNTER — Emergency Department: Payer: Medicaid Other

## 2014-12-20 DIAGNOSIS — N938 Other specified abnormal uterine and vaginal bleeding: Secondary | ICD-10-CM

## 2014-12-20 DIAGNOSIS — D259 Leiomyoma of uterus, unspecified: Secondary | ICD-10-CM

## 2014-12-20 DIAGNOSIS — R102 Pelvic and perineal pain: Secondary | ICD-10-CM

## 2014-12-20 MED ORDER — OXYCODONE-ACETAMINOPHEN 5-325 MG PO TABS
1.0000 | ORAL_TABLET | Freq: Once | ORAL | Status: AC
  Administered 2014-12-20: 1 via ORAL
  Filled 2014-12-20: qty 1

## 2014-12-20 MED ORDER — KETOROLAC TROMETHAMINE 10 MG PO TABS
10.0000 mg | ORAL_TABLET | Freq: Once | ORAL | Status: AC
  Administered 2014-12-20: 10 mg via ORAL
  Filled 2014-12-20: qty 1

## 2014-12-20 NOTE — Discharge Instructions (Signed)
Abnormal Uterine Bleeding °Abnormal uterine bleeding can affect women at various stages in life, including teenagers, women in their reproductive years, pregnant women, and women who have reached menopause. Several kinds of uterine bleeding are considered abnormal, including: °· Bleeding or spotting between periods.   °· Bleeding after sexual intercourse.   °· Bleeding that is heavier or more than normal.   °· Periods that last longer than usual. °· Bleeding after menopause.   °Many cases of abnormal uterine bleeding are minor and simple to treat, while others are more serious. Any type of abnormal bleeding should be evaluated by your health care provider. Treatment will depend on the cause of the bleeding. °HOME CARE INSTRUCTIONS °Monitor your condition for any changes. The following actions may help to alleviate any discomfort you are experiencing: °· Avoid the use of tampons and douches as directed by your health care provider. °· Change your pads frequently. °You should get regular pelvic exams and Pap tests. Keep all follow-up appointments for diagnostic tests as directed by your health care provider.  °SEEK MEDICAL CARE IF:  °· Your bleeding lasts more than 1 week.   °· You feel dizzy at times.   °SEEK IMMEDIATE MEDICAL CARE IF:  °· You pass out.   °· You are changing pads every 15 to 30 minutes.   °· You have abdominal pain. °· You have a fever.   °· You become sweaty or weak.   °· You are passing large blood clots from the vagina.   °· You start to feel nauseous and vomit. °MAKE SURE YOU:  °· Understand these instructions. °· Will watch your condition. °· Will get help right away if you are not doing well or get worse. °  °This information is not intended to replace advice given to you by your health care provider. Make sure you discuss any questions you have with your health care provider. °  °Document Released: 12/31/2004 Document Revised: 01/05/2013 Document Reviewed: 07/30/2012 °Elsevier Interactive  Patient Education ©2016 [employer] ° ° ° °Uterine Fibroids °Uterine fibroids are tissue masses (tumors) that can develop in the womb (uterus). They are also called leiomyomas. This type of tumor is not cancerous (benign) and does not spread to other parts of the body outside of the pelvic area, which is between the hip bones. Occasionally, fibroids may develop in the fallopian tubes, in the cervix, or on the support structures (ligaments) that surround the uterus. °You can have one or many fibroids. Fibroids can vary in size, weight, and where they grow in the uterus. Some can become quite large. Most fibroids do not require medical treatment. °CAUSES °A fibroid can develop when a single uterine cell keeps growing (replicating). Most cells in the human body have a control mechanism that keeps them from replicating without control. °SIGNS AND SYMPTOMS °Symptoms may include:  °· Heavy bleeding during your period. °· Bleeding or spotting between periods. °· Pelvic pain and pressure. °· Bladder problems, such as needing to urinate more often (urinary frequency) or urgently. °· Inability to reproduce offspring (infertility). °· Miscarriages. °DIAGNOSIS °Uterine fibroids are diagnosed through a physical exam. Your health care provider may feel the lumpy tumors during a pelvic exam. Ultrasonography and an MRI may be done to determine the size, location, and number of fibroids. °TREATMENT °Treatment may include: °· Watchful waiting. This involves getting the fibroid checked by your health care provider to see if it grows or shrinks. Follow your health care provider's recommendations for how often to have this checked. °· Hormone medicines. These can be taken by   mouth or given through an intrauterine device (IUD). °· Surgery. °¨ Removing the fibroids (myomectomy) or the uterus (hysterectomy). °¨ Removing blood supply to the fibroids (uterine artery embolization). °If fibroids interfere with your fertility and you want to  become pregnant, your health care provider may recommend having the fibroids removed.  °HOME CARE INSTRUCTIONS °· Keep all follow-up visits as directed by your health care provider. This is important. °· Take medicines only as directed by your health care provider. °¨ If you were prescribed a hormone treatment, take the hormone medicines exactly as directed. °¨ Do not take aspirin, because it can cause bleeding. °· Ask your health care provider about taking iron pills and increasing the amount of dark green, leafy vegetables in your diet. These actions can help to boost your blood iron levels, which may be affected by heavy menstrual bleeding. °· Pay close attention to your period and tell your health care provider about any changes, such as: °¨ Increased blood flow that requires you to use more pads or tampons than usual per month. °¨ A change in the number of days that your period lasts per month. °¨ A change in symptoms that are associated with your period, such as abdominal cramping or back pain. °SEEK MEDICAL CARE IF: °· You have pelvic pain, back pain, or abdominal cramps that cannot be controlled with medicines. °· You have an increase in bleeding between and during periods. °· You soak tampons or pads in a half hour or less. °· You feel lightheaded, extra tired, or weak. °SEEK IMMEDIATE MEDICAL CARE IF: °· You faint. °· You have a sudden increase in pelvic pain. °  °This information is not intended to replace advice given to you by your health care provider. Make sure you discuss any questions you have with your health care provider. °  °Document Released: 12/29/1999 Document Revised: 01/21/2014 Document Reviewed: 06/29/2013 °Elsevier Interactive Patient Education ©2016 Elsevier Inc. ° °

## 2014-12-20 NOTE — ED Provider Notes (Signed)
[facility] Emergency Department Provider Note  ____________________________________________  Time seen:   I have reviewed the triage vital signs and the nursing notes.   HISTORY  Chief Complaint Vaginal Bleeding     HPI Courtney Webb is a 39 y.o. female presents with ongoing vaginal bleeding since last Sunday. Patient states that she was seen in the emergency department and diagnosed with a right ovarian cysts. Patient admits to continued right pelvic pain that she describes as a pulling sensation". Patient denies any fever. Patient states that she has an appointment with Dr. Laurey Morale for December 13 however "I can wait that long".    Past Medical History  Diagnosis Date  . Non Hodgkin's lymphoma ([place]) 2008  . Ectopic pregnancy   . Non Hodgkin's lymphoma ([place])     There are no active problems to display for this patient.   Past Surgical History  Procedure Laterality Date  . Abdominal surgery    . Portacath placement      Current Outpatient Rx  Name  Route  Sig  Dispense  Refill  . doxycycline (VIBRA-TABS) 100 MG tablet   Oral   Take 1 tablet (100 mg total) by mouth 2 (two) times daily.   20 tablet   0   . HYDROcodone-acetaminophen (NORCO/VICODIN) 5-325 MG per tablet   Oral   Take 1 tablet by mouth every 4 (four) hours as needed for moderate pain.   20 tablet   0   . ibuprofen (ADVIL,MOTRIN) 600 MG tablet   Oral   Take 1 tablet (600 mg total) by mouth every 6 (six) hours as needed for moderate pain.   100 tablet   2   . metroNIDAZOLE (FLAGYL) 500 MG tablet   Oral   Take 1 tablet (500 mg total) by mouth 3 (three) times daily.   30 tablet   0   . naproxen (NAPROSYN) 500 MG tablet   Oral   Take 1 tablet (500 mg total) by mouth 2 (two) times daily with a meal.   20 tablet   0   . oxyCODONE-acetaminophen (ROXICET) 5-325 MG tablet   Oral   Take 1 tablet by mouth every 6 (six) hours as needed for severe pain.   12 tablet    0   . promethazine (PHENERGAN) 25 MG tablet   Oral   Take 1 tablet (25 mg total) by mouth every 6 (six) hours as needed for nausea or vomiting.   15 tablet   0     Allergies No known drug allergies No family history on file.  Social History Social History  Substance Use Topics  . Smoking status: Current Every Day Smoker  . Smokeless tobacco: None  . Alcohol Use: No    Review of Systems  Constitutional: Negative for fever. Eyes: Negative for visual changes. ENT: Negative for sore throat. Cardiovascular: Negative for chest pain. Respiratory: Negative for shortness of breath. Gastrointestinal: Negative for abdominal pain, vomiting and diarrhea. Genitourinary: Negative for dysuria. Positive for pelvic pain Musculoskeletal: Negative for back pain. Skin: Negative for rash. Neurological: Negative for headaches, focal weakness or numbness.   10-point ROS otherwise negative.  ____________________________________________   PHYSICAL EXAM:  VITAL SIGNS: ED Triage Vitals  Enc Vitals Group     BP 12/19/14 2143 156/95 mmHg     Pulse Rate 12/19/14 2143 88     Resp 12/19/14 2143 18     Temp 12/19/14 2143 98.1 F (36.7 C)     Temp  Source 12/19/14 2143 Oral     SpO2 12/19/14 2143 100 %     Weight 12/19/14 2143 160 lb (72.576 kg)     Height 12/19/14 2143 5\' 3"  (1.6 m)     Head Cir --      Peak Flow --      Pain Score 12/19/14 2149 6     Pain Loc --      Pain Edu? --      Excl. in [place]? --      Constitutional: Alert and oriented. Well appearing and in no distress. Eyes: Conjunctivae are normal. PERRL. Normal extraocular movements. ENT   Head: Normocephalic and atraumatic.   Nose: No congestion/rhinnorhea.   Mouth/Throat: Mucous membranes are moist.   Neck: No stridor. Hematological/Lymphatic/Immunilogical: No cervical lymphadenopathy. Cardiovascular: Normal rate, regular rhythm. Normal and symmetric distal pulses are present in all extremities. No murmurs,  rubs, or gallops. Respiratory: Normal respiratory effort without tachypnea nor retractions. Breath sounds are clear and equal bilaterally. No wheezes/rales/rhonchi. Gastrointestinal: Soft and nontender. No distention. There is no CVA tenderness. Genitourinary: deferred Musculoskeletal: Nontender with normal range of motion in all extremities. No joint effusions.  No lower extremity tenderness nor edema. Neurologic:  Normal speech and language. No gross focal neurologic deficits are appreciated. Speech is normal.  Skin:  Skin is warm, dry and intact. No rash noted. Psychiatric: Mood and affect are normal. Speech and behavior are normal. Patient exhibits appropriate insight and judgment.  ____________________________________________    LABS (pertinent positives/negatives)  Labs Reviewed  COMPREHENSIVE METABOLIC PANEL - Abnormal; Notable for the following:    AST 13 (*)    ALT 8 (*)    Total Bilirubin 0.2 (*)    All other components within normal limits  HEMOGLOBIN AND HEMATOCRIT, BLOOD  POC URINE PREG, ED  POCT PREGNANCY, URINE         RADIOLOGY    US Pelvis Complete (Final result) Result time: 12/20/14 03:45:59   Final result by Rad Results In Interface (12/20/14 03:45:59)   Narrative:   CLINICAL DATA: Acute onset of pelvic pain and vaginal bleeding. Initial encounter.  EXAM: TRANSABDOMINAL AND TRANSVAGINAL ULTRASOUND OF PELVIS  TECHNIQUE: Both transabdominal and transvaginal ultrasound examinations of the pelvis were performed. Transabdominal technique was performed for global imaging of the pelvis including uterus, ovaries, adnexal regions, and pelvic cul-de-sac. It was necessary to proceed with endovaginal exam following the transabdominal exam to visualize the uterus and ovaries in greater detail.  COMPARISON: Pelvic ultrasound performed 12/11/2014  FINDINGS: Uterus  Measurements: 8.5 x 4.9 x 5.8 cm. Scattered small myometrial fibroids are seen,  measuring up to 1.7 cm in size.  Endometrium  Thickness: 0.9 cm. No focal abnormality visualized.  Right ovary  Measurements: 3.6 x 2.4 x 2.0 cm. There appears to be a cyst within a cyst at the right ovary, measuring 2.4 cm within a 3.9 cm cyst. This is most compatible with a normal stimulated ovarian follicle.  Left ovary  Measurements: 3.4 x 1.4 x 1.8 cm. Normal appearance/no adnexal mass.  Other findings  No free fluid is seen within the pelvic cul-de-sac.  IMPRESSION: 1. No acute abnormality seen within the pelvis. 2. Small myometrial fibroids, measuring up to 1.7 cm in size. 3. Ovaries unremarkable in appearance, with a right-sided follicle noted. No evidence for ovarian torsion.   Electronically Signed By: Garald Balding M.D. On: 12/20/2014 03:45          US Transvaginal Non-OB (Final result) Result time: 12/20/14 03:45:59  Final result by Rad Results In Interface (12/20/14 03:45:59)   Narrative:   CLINICAL DATA: Acute onset of pelvic pain and vaginal bleeding. Initial encounter.  EXAM: TRANSABDOMINAL AND TRANSVAGINAL ULTRASOUND OF PELVIS  TECHNIQUE: Both transabdominal and transvaginal ultrasound examinations of the pelvis were performed. Transabdominal technique was performed for global imaging of the pelvis including uterus, ovaries, adnexal regions, and pelvic cul-de-sac. It was necessary to proceed with endovaginal exam following the transabdominal exam to visualize the uterus and ovaries in greater detail.  COMPARISON: Pelvic ultrasound performed 12/11/2014  FINDINGS: Uterus  Measurements: 8.5 x 4.9 x 5.8 cm. Scattered small myometrial fibroids are seen, measuring up to 1.7 cm in size.  Endometrium  Thickness: 0.9 cm. No focal abnormality visualized.  Right ovary  Measurements: 3.6 x 2.4 x 2.0 cm. There appears to be a cyst within a cyst at the right ovary, measuring 2.4 cm within a 3.9 cm cyst. This is most compatible with  a normal stimulated ovarian follicle.  Left ovary  Measurements: 3.4 x 1.4 x 1.8 cm. Normal appearance/no adnexal mass.  Other findings  No free fluid is seen within the pelvic cul-de-sac.  IMPRESSION: 1. No acute abnormality seen within the pelvis. 2. Small myometrial fibroids, measuring up to 1.7 cm in size. 3. Ovaries unremarkable in appearance, with a right-sided follicle noted. No evidence for ovarian torsion.   Electronically Signed By: Garald Balding M.D. On: 12/20/2014 03:45           INITIAL IMPRESSION / ASSESSMENT AND PLAN / ED COURSE  Pertinent labs & imaging results that were available during my care of the patient were reviewed by me and considered in my medical decision making (see chart for details).    ____________________________________________   FINAL CLINICAL IMPRESSION(S) / ED DIAGNOSES  Final diagnoses:  Pelvic pain in female  Dysfunctional uterine bleeding  Uterine leiomyoma, unspecified location      Gregor Hams, MD 12/23/14 [phone]

## 2016-03-18 ENCOUNTER — Emergency Department
Admission: EM | Admit: 2016-03-18 | Discharge: 2016-03-19 | Disposition: A | Discharge status: Home / Self Care | Payer: Medicaid Other | Attending: Emergency Medicine | Admitting: Emergency Medicine

## 2016-03-18 ENCOUNTER — Encounter: Payer: Self-pay | Admitting: Emergency Medicine

## 2016-03-18 ENCOUNTER — Emergency Department: Payer: Medicaid Other

## 2016-03-18 DIAGNOSIS — R1013 Epigastric pain: Secondary | ICD-10-CM | POA: Diagnosis present

## 2016-03-18 DIAGNOSIS — F17210 Nicotine dependence, cigarettes, uncomplicated: Secondary | ICD-10-CM | POA: Insufficient documentation

## 2016-03-18 DIAGNOSIS — K8020 Calculus of gallbladder without cholecystitis without obstruction: Secondary | ICD-10-CM | POA: Insufficient documentation

## 2016-03-18 LAB — CBC
HCT: 37.3 % (ref 35.0–47.0)
Hemoglobin: 12.6 g/dL (ref 12.0–16.0)
MCH: 31.8 pg (ref 26.0–34.0)
MCHC: 33.8 g/dL (ref 32.0–36.0)
MCV: 93.9 fL (ref 80.0–100.0)
PLATELETS: 240 10*3/uL (ref 150–440)
RBC: 3.97 MIL/uL (ref 3.80–5.20)
RDW: 14.8 % — AB (ref 11.5–14.5)
WBC: 8.3 10*3/uL (ref 3.6–11.0)

## 2016-03-18 LAB — COMPREHENSIVE METABOLIC PANEL
ALT: 7 U/L — AB (ref 14–54)
AST: 13 U/L — AB (ref 15–41)
Albumin: 3.7 g/dL (ref 3.5–5.0)
Alkaline Phosphatase: 40 U/L (ref 38–126)
Anion gap: 6 (ref 5–15)
BILIRUBIN TOTAL: 0.5 mg/dL (ref 0.3–1.2)
BUN: 10 mg/dL (ref 6–20)
CO2: 27 mmol/L (ref 22–32)
Calcium: 8.8 mg/dL — ABNORMAL LOW (ref 8.9–10.3)
Chloride: 107 mmol/L (ref 101–111)
Creatinine, Ser: 0.83 mg/dL (ref 0.44–1.00)
GFR calc Af Amer: >60 mL/min (ref >60)
Glucose, Bld: 94 mg/dL (ref 65–99)
Potassium: 3.6 mmol/L (ref 3.5–5.1)
Sodium: 140 mmol/L (ref 135–145)
TOTAL PROTEIN: 7.3 g/dL (ref 6.5–8.1)

## 2016-03-18 LAB — URINALYSIS, COMPLETE (UACMP) WITH MICROSCOPIC
BILIRUBIN URINE: NEGATIVE
Bacteria, UA: NONE SEEN
Glucose, UA: NEGATIVE mg/dL
Hgb urine dipstick: NEGATIVE
Ketones, ur: 5 mg/dL — AB
LEUKOCYTES UA: NEGATIVE
NITRITE: NEGATIVE
PH: 5 (ref 5.0–8.0)
Protein, ur: NEGATIVE mg/dL
SPECIFIC GRAVITY, URINE: 1.025 (ref 1.005–1.030)
SQUAMOUS EPITHELIAL / LPF: NONE SEEN

## 2016-03-18 LAB — POCT PREGNANCY, URINE: Preg Test, Ur: NEGATIVE

## 2016-03-18 LAB — LIPASE, BLOOD: Lipase: 37 U/L (ref 11–51)

## 2016-03-18 MED ORDER — IOPAMIDOL (ISOVUE-300) INJECTION 61%
30.0000 mL | Freq: Once | INTRAVENOUS | Status: AC
  Administered 2016-03-18: 30 mL via ORAL

## 2016-03-18 MED ORDER — ONDANSETRON HCL 4 MG/2ML IJ SOLN
4.0000 mg | Freq: Once | INTRAMUSCULAR | Status: AC
  Administered 2016-03-18: 4 mg via INTRAVENOUS
  Filled 2016-03-18: qty 2

## 2016-03-18 MED ORDER — MORPHINE SULFATE (PF) 4 MG/ML IV SOLN
4.0000 mg | Freq: Once | INTRAVENOUS | Status: AC
  Administered 2016-03-18: 4 mg via INTRAVENOUS

## 2016-03-18 MED ORDER — SODIUM CHLORIDE 0.9 % IV BOLUS (SEPSIS)
1000.0000 mL | Freq: Once | INTRAVENOUS | Status: AC
  Administered 2016-03-18: 1000 mL via INTRAVENOUS

## 2016-03-18 MED ORDER — IOPAMIDOL (ISOVUE-300) INJECTION 61%
100.0000 mL | Freq: Once | INTRAVENOUS | Status: AC | PRN
  Administered 2016-03-18: 100 mL via INTRAVENOUS

## 2016-03-18 MED ORDER — MORPHINE SULFATE (PF) 4 MG/ML IV SOLN
4.0000 mg | Freq: Once | INTRAVENOUS | Status: AC
  Administered 2016-03-18: 4 mg via INTRAVENOUS
  Filled 2016-03-18: qty 1

## 2016-03-18 MED ORDER — MORPHINE SULFATE (PF) 4 MG/ML IV SOLN
INTRAVENOUS | Status: AC
  Administered 2016-03-18: 4 mg via INTRAVENOUS
  Filled 2016-03-18: qty 1

## 2016-03-18 NOTE — ED Triage Notes (Signed)
Pt presents to ED with epigastric abd pain for the past 3 days. Pt states taking a bath sometimes helps with her pain but it did not help today. Denies vomiting or similar symptoms previously.

## 2016-03-18 NOTE — ED Provider Notes (Signed)
[facility] Emergency Department Provider Note  ____________________________________________  Time seen: Approximately 9:54 PM  I have reviewed the triage vital signs and the nursing notes.   HISTORY  Chief Complaint Abdominal Pain    HPI Courtney Webb is a 41 y.o. female who presents to the emergency department for evaluation of epigastric pain. She reports pain started 3 days ago.She has not taken anything for pain. She can not pinpoint any triggering event. She has had some nausea without vomiting. She denies diarrhea. She states she feels "bloated." She reports decreased appetite at baseline, but has only eaten some "crackers" today. Pain typically worsens at night and is relieved occasionally by a warm bath. She denies recent illness.  Past Medical History:  Diagnosis Date  . Ectopic pregnancy   . Non Hodgkin's lymphoma ([place]) 2008  . Non Hodgkin's lymphoma ([place])     There are no active problems to display for this patient.   Past Surgical History:  Procedure Laterality Date  . ABDOMINAL SURGERY    . PORTACATH PLACEMENT      Prior to Admission medications   Medication Sig Start Date End Date Taking? Authorizing Provider  doxycycline (VIBRA-TABS) 100 MG tablet Take 1 tablet (100 mg total) by mouth 2 (two) times daily. 05/27/14   Harvest Dark, MD  HYDROcodone-acetaminophen (NORCO/VICODIN) 5-325 MG per tablet Take 1 tablet by mouth every 4 (four) hours as needed for moderate pain. 05/27/14   Harvest Dark, MD  metoCLOPramide (REGLAN) 10 MG tablet Take 1 tablet (10 mg total) by mouth 3 (three) times daily with meals. 03/19/16 03/19/17  Victorino Dike, FNP  metroNIDAZOLE (FLAGYL) 500 MG tablet Take 1 tablet (500 mg total) by mouth 3 (three) times daily. 12/11/14   Carrie Mew, MD  naproxen (NAPROSYN) 500 MG tablet Take 1 tablet (500 mg total) by mouth 2 (two) times daily with a meal. 12/11/14   Carrie Mew, MD  oxyCODONE-acetaminophen  (ROXICET) 5-325 MG tablet Take 1 tablet by mouth every 4 (four) hours as needed for severe pain. 03/19/16   Victorino Dike, FNP  promethazine (PHENERGAN) 25 MG tablet Take 1 tablet (25 mg total) by mouth every 6 (six) hours as needed for nausea or vomiting. 12/11/14   Carrie Mew, MD    Allergies Patient has no known allergies.  No family history on file.  Social History Social History  Substance Use Topics  . Smoking status: Current Every Day Smoker    Packs/day: 1.00    Types: Cigarettes  . Smokeless tobacco: Never Used  . Alcohol use No    Review of Systems Constitutional: Negataive for fever. Respiratory: Negative for shortness of breath or cough. Gastrointestinal: Positive for abdominal pain; positive for nausea , negatiave for vomiting. Genitourinary: Negative for dysuria , negative for vaginal discharge. Musculoskeletal: Positive for lower back pain. Skin: Negative for lesion, rash, or wound. ____________________________________________   PHYSICAL EXAM:  VITAL SIGNS: ED Triage Vitals [03/18/16 2148]  Enc Vitals Group     BP 127/90     Pulse Rate 94     Resp 18     Temp 98.6 F (37 C)     Temp Source Oral     SpO2 98 %     Weight 158 lb (71.7 kg)     Height 5\' 3"  (1.6 m)     Head Circumference      Peak Flow      Pain Score 8     Pain Loc  Pain Edu?      Excl. in [place]?     Constitutional: Alert and oriented. Well appearing and in no acute distress. Eyes: Conjunctivae are normal. PERRL. EOMI. Head: Atraumatic. Nose: No congestion/rhinnorhea. Mouth/Throat: Mucous membranes are moist. Respiratory: Normal respiratory effort.  No retractions. Gastrointestinal: Abdomen is distended but soft. No rebound or guarding. Pain on palpation over the epigastric area without bruit. Genitourinary: Pelvic exam: not indicated. Musculoskeletal: No extremity tenderness nor edema.  Neurologic:  Normal speech and language. No gross focal neurologic deficits are  appreciated. Speech is normal. No gait instability. Skin:  Skin is warm, dry and intact. No rash noted. Psychiatric: Mood and affect are normal. Speech and behavior are normal.  ____________________________________________   LABS (all labs ordered are listed, but only abnormal results are displayed)  Labs Reviewed  COMPREHENSIVE METABOLIC PANEL - Abnormal; Notable for the following:       Result Value   Calcium 8.8 (*)    AST 13 (*)    ALT 7 (*)    All other components within normal limits  CBC - Abnormal; Notable for the following:    RDW 14.8 (*)    All other components within normal limits  URINALYSIS, COMPLETE (UACMP) WITH MICROSCOPIC - Abnormal; Notable for the following:    Color, Urine YELLOW (*)    APPearance CLEAR (*)    Ketones, ur 5 (*)    All other components within normal limits  LIPASE, BLOOD  POC URINE PREG, ED  POCT PREGNANCY, URINE   ____________________________________________  RADIOLOGY  Per radiology, CT abdomen and pelvis with contrast reveals a normal appendix, multiple calcified gallstones without dilatation or dilatation, prominent left greater than right SI joint sclerosis which is nonspecific. ____________________________________________   PROCEDURES  Procedure(s) performed: None  ____________________________________________   INITIAL IMPRESSION / ASSESSMENT AND PLAN / ED COURSE  41 year old female presenting to the emergency department for evaluation of epigastric pain. The patient does appear to be very uncomfortable. She will be given morphine and Zofran and CT scan will be requested after urine pregnancy and lab studies are back.  2300: Patient reported decrease in pain after the initial dose of morphine, however is now again complaining of abdominal pain. No change in her assessment. Her abdomen remains mildly distended but not firm and bowel sounds are still present in all 4 quadrants. No bruit noted over the epigastrium.  0030: Pain  well controlled after the last dose of morphine, but she states that the pain is returning. She will be given some Reglan and Dilaudid. Plan to discharge her home if we can get her pain under control. She will need to follow-up with a surgeon to schedule a cholecystectomy. Results of her scans and blood work was reviewed with the patient.  0133: Discharge plan discussed with the patient who verbalizes understanding. Pain is controlled at this time. She will be prescribed reglan and percocet. She is to return to the ER for symptoms that change or worsen if unable to schedule an appointment.    Pertinent labs & imaging results that were available during my care of the patient were reviewed by me and considered in my medical decision making (see chart for details). _________________________________________   FINAL CLINICAL IMPRESSION(S) / ED DIAGNOSES  Final diagnoses:  Calculus of gallbladder without cholecystitis without obstruction    Note:  This document was prepared using Dragon voice recognition software and may include unintentional dictation errors.    Victorino Dike, FNP 03/22/16 2150  Loney Hering, MD 03/25/16 [phone]

## 2016-03-18 NOTE — ED Notes (Signed)
Patient transported to CT 

## 2016-03-19 MED ORDER — HYDROMORPHONE HCL 1 MG/ML IJ SOLN: 1.0000 mg | Freq: Once | INTRAMUSCULAR | Status: DC

## 2016-03-19 MED ORDER — METOCLOPRAMIDE HCL 10 MG PO TABS: 10.0000 mg | ORAL_TABLET | Freq: Three times a day (TID) | ORAL | 0 refills | Status: DC

## 2016-03-19 MED ORDER — OXYCODONE-ACETAMINOPHEN 5-325 MG PO TABS: 1.0000 | ORAL_TABLET | ORAL | 0 refills | Status: DC | PRN

## 2016-03-19 MED ORDER — METOCLOPRAMIDE HCL 5 MG/ML IJ SOLN: 10.0000 mg | Freq: Once | INTRAMUSCULAR | Status: DC

## 2016-03-19 NOTE — Discharge Instructions (Signed)
Please call and schedule a follow up with the surgeon. Return to the ER for symptoms that change or worsen if unable to schedule an appointment.

## 2016-04-02 ENCOUNTER — Ambulatory Visit (INDEPENDENT_AMBULATORY_CARE_PROVIDER_SITE_OTHER): Payer: Medicaid Other | Admitting: General Surgery

## 2016-04-02 ENCOUNTER — Encounter: Payer: Self-pay | Admitting: General Surgery

## 2016-04-02 ENCOUNTER — Telehealth: Payer: Self-pay

## 2016-04-02 VITALS — BP 145/93 | HR 86 | Temp 98.1°F | Ht 63.0 in | Wt 164.5 lb

## 2016-04-02 DIAGNOSIS — M7989 Other specified soft tissue disorders: Secondary | ICD-10-CM

## 2016-04-02 DIAGNOSIS — K432 Incisional hernia without obstruction or gangrene: Secondary | ICD-10-CM | POA: Insufficient documentation

## 2016-04-02 DIAGNOSIS — M799 Soft tissue disorder, unspecified: Secondary | ICD-10-CM | POA: Diagnosis not present

## 2016-04-02 HISTORY — DX: Incisional hernia without obstruction or gangrene: K43.2

## 2016-04-02 HISTORY — DX: Other specified soft tissue disorders: M79.89

## 2016-04-02 MED ORDER — OXYCODONE-ACETAMINOPHEN 5-325 MG PO TABS: 1.0000 | ORAL_TABLET | Freq: Four times a day (QID) | ORAL | 0 refills | Status: DC | PRN

## 2016-04-02 MED ORDER — PANTOPRAZOLE SODIUM 40 MG PO TBEC: 40.0000 mg | DELAYED_RELEASE_TABLET | Freq: Every day | ORAL | 1 refills | Status: DC
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
## 2016-04-02 NOTE — Patient Instructions (Addendum)
We have scheduled your MRI for 04/12/16 at 1030am at the [facility]. You may have nothing to eat after midnight the night prior to your testing.  We will have you follow-up in the office with Dr. Adonis Huguenin on 04/17/16 at 3:15pm.  In the meantime, you will be set-up with Oncology to follow-up on your History of Lymphoma. I will call you as soon as I have this appointment information.  Please pick-up your medications at the pharmacy and start your Pantoprazole (Acid Reflux Medicine) today. You will take this once daily in the morning on an empty stomach.

## 2016-04-02 NOTE — Telephone Encounter (Signed)
I have put in an internal referral to [facility].   I will follow up within 3-5 days to make sure the appointments have been scheduled.

## 2016-04-03 NOTE — ED Provider Notes (Signed)
ED ECG REPORT I, Loney Hering, the attending physician, personally viewed and interpreted this ECG.   Date: 03/19/2016  EKG Time: 2150  Rate: 98  Rhythm: normal sinus rhythm  Axis: normal  Intervals:none  ST&T Change: none    Loney Hering, MD 04/03/16 [phone]

## 2016-04-07 NOTE — Progress Notes (Signed)
[place]  [phone] [phone]) [phone]  ID: Courtney Webb OB: December 27, 1975  MR#: [id number]  UKG#:[id number]  Patient Care Team: [facility] Department as PCP - General  CHIEF COMPLAINT: History of non-Hodgkin's Lymphoma.  INTERVAL HISTORY: Patient is a 41 year old female with a distant history of non-Hodgkin's lymphoma approximately 10 years ago that was treated at [facility]. She recently presented with abdominal pain and was noted to have a soft tissue mass on her right flank. Patient was referred to clinic for further evaluation. She currently feels well and is asymptomatic. She does not complain of abdominal pain today. She denies any fevers, chills, night sweats, or weight loss. She has no neurologic complaints. She denies any chest pain or shortness of breath. She denies any nausea, vomiting, constipation, or diarrhea. She has no urinary complaints. Patient offers no specific complaints today.  REVIEW OF SYSTEMS:   Review of Systems  Constitutional: Negative.  Negative for chills, fever, malaise/fatigue and weight loss.  Respiratory: Negative.  Negative for cough and shortness of breath.   Cardiovascular: Negative.  Negative for chest pain and leg swelling.  Gastrointestinal: Negative.  Negative for abdominal pain, blood in stool, melena, nausea and vomiting.  Genitourinary: Negative.   Musculoskeletal: Negative.   Neurological: Negative.  Negative for sensory change and weakness.  Endo/Heme/Allergies: Does not bruise/bleed easily.  Psychiatric/Behavioral: Negative.  The patient is not nervous/anxious.     As per HPI. Otherwise, a complete review of systems is negative.  PAST MEDICAL HISTORY: Past Medical History:  Diagnosis Date  . Bipolar disorder ([place])   . Ectopic pregnancy   . Non Hodgkin's lymphoma ([place]) 2008  . Non Hodgkin's lymphoma ([place])     PAST SURGICAL HISTORY: Past Surgical History:  Procedure  Laterality Date  . ABDOMINAL SURGERY  2011   Left Salpingectomy-Ectopic Pregnancy 2011 and 2012  . EXPLORATORY LAPAROTOMY  2000   possible bowel esection and partial resection  . PORTACATH PLACEMENT  2008   Lymphoma, non-Hodgkin's    FAMILY HISTORY: Family History  Problem Relation Age of Onset  . Other Sister     [place]  . Hypertension Maternal Aunt   . COPD Father     ADVANCED DIRECTIVES (Y/N):  N  HEALTH MAINTENANCE: Social History  Substance Use Topics  . Smoking status: Current Every Day Smoker    Packs/day: 0.50    Years: 20.00    Types: Cigarettes  . Smokeless tobacco: Never Used  . Alcohol use No     Colonoscopy:  PAP:  Bone density:  Lipid panel:  No Known Allergies  Current Outpatient Prescriptions  Medication Sig Dispense Refill  . metoCLOPramide (REGLAN) 10 MG tablet Take 1 tablet (10 mg total) by mouth 3 (three) times daily with meals. 90 tablet 0  . oxyCODONE-acetaminophen (ROXICET) 5-325 MG tablet Take 1 tablet by mouth every 6 (six) hours as needed for severe pain. 20 tablet 0  . pantoprazole (PROTONIX) 40 MG tablet Take 1 tablet (40 mg total) by mouth daily. 30 tablet 1   No current facility-administered medications for this visit.     OBJECTIVE: Vitals:   04/08/16 1118  BP: (!) 154/107  Pulse: 74  Resp: 18  Temp: 97.8 F (36.6 C)     Body mass index is 29.25 kg/m.    ECOG FS:0 - Asymptomatic  General: Well-developed, well-nourished, no acute distress. Eyes: Pink conjunctiva, anicteric sclera. HEENT: Normocephalic, moist mucous membranes, clear oropharnyx. Lungs: Clear to auscultation bilaterally. Heart: Regular  rate and rhythm. No rubs, murmurs, or gallops. Abdomen: Soft, nontender, nondistended. No organomegaly noted, normoactive bowel sounds. Musculoskeletal: No edema, cyanosis, or clubbing. Minimally palpable mass on right flank. Neuro: Alert, answering all questions appropriately. Cranial nerves grossly intact. Skin: No  rashes or petechiae noted. Psych: Normal affect. Lymphatics: No cervical, calvicular, axillary or inguinal LAD.   LAB RESULTS:  Lab Results  Component Value Date   NA 140 03/18/2016   K 3.6 03/18/2016   CL 107 03/18/2016   CO2 27 03/18/2016   GLUCOSE 94 03/18/2016   BUN 10 03/18/2016   CREATININE 0.83 03/18/2016   CALCIUM 8.8 (L) 03/18/2016   PROT 7.3 03/18/2016   ALBUMIN 3.7 03/18/2016   AST 13 (L) 03/18/2016   ALT 7 (L) 03/18/2016   ALKPHOS 40 03/18/2016   BILITOT 0.5 03/18/2016   GFRNONAA >60 03/18/2016   GFRAA >60 03/18/2016    Lab Results  Component Value Date   WBC 8.3 03/18/2016   NEUTROABS 14.7 (H) 05/27/2014   HGB 12.6 03/18/2016   HCT 37.3 03/18/2016   MCV 93.9 03/18/2016   PLT 240 03/18/2016     STUDIES: Ct Abdomen Pelvis W Contrast  Result Date: 03/18/2016 CLINICAL DATA:  Epigastric abdominal pain EXAM: CT ABDOMEN AND PELVIS WITH CONTRAST TECHNIQUE: Multidetector CT imaging of the abdomen and pelvis was performed using the standard protocol following bolus administration of intravenous contrast. CONTRAST:  15mL ISOVUE-300 IOPAMIDOL (ISOVUE-300) INJECTION 61% COMPARISON:  CT 12/11/2014 FINDINGS: Lower chest: Lung bases clear.  Normal heart size. Hepatobiliary: Multiple calcified gallstones. No wall thickening or biliary dilatation. No focal hepatic abnormality. Pancreas: Unremarkable. No pancreatic ductal dilatation or surrounding inflammatory changes. Spleen: Normal in size without focal abnormality. Adrenals/Urinary Tract: Adrenal glands are unremarkable. Kidneys are normal, without renal calculi, focal lesion, or hydronephrosis. Bladder is unremarkable. Stomach/Bowel: Stomach is within normal limits. Appendix appears normal. No evidence of bowel wall thickening, distention, or inflammatory changes. Vascular/Lymphatic: No significant vascular findings are present. No enlarged abdominal or pelvic lymph nodes. Reproductive: Uterus and bilateral adnexa are  unremarkable. Other: No free air.  Trace free fluid in the posterior pelvis. Musculoskeletal: Prominent left greater than right SI joint sclerosis. No acute osseous abnormality IMPRESSION: 1. Normal appendix. 2. Multiple calcified gallstones.  No biliary dilatation. 3. Prominent left greater than right SI joint sclerosis, nonspecific but can be seen with seronegative spondyloarthropathy. Electronically Signed   By: Donavan Foil M.D.   On: 03/18/2016 22:59    ASSESSMENT: History of non-Hodgkin's Lymphoma.  PLAN:    1. History of non-Hodgkin's Lymphoma: Patient has no obvious evidence of recurrence. CT scan results from March 18, 2016 reviewed independently and reported as above. Unclear etiology of right flank mass. MRI was ordered by surgery is pending at time of dictation. If MRI positive, patient will return to clinic for further evaluation and additional diagnostic planning. If MRI is negative, no further follow-up is necessary. Patient has also been instructed to keep her previously scheduled follow-up appointment with surgery on April 17, 2016.  Approximately 45 minutes was spent in discussion of which greater than 50% was consultation.  Patient expressed understanding and was in agreement with this plan. She also understands that She can call clinic at any time with any questions, concerns, or complaints.    Lloyd Huger, MD   04/14/2016 4:03 PM

## 2016-04-08 ENCOUNTER — Inpatient Hospital Stay: Payer: Medicaid Other | Attending: Oncology | Admitting: Oncology

## 2016-04-08 ENCOUNTER — Ambulatory Visit: Payer: Medicaid Other | Admitting: Oncology

## 2016-04-08 VITALS — BP 154/107 | HR 74 | Temp 97.8°F | Resp 18 | Wt 165.1 lb

## 2016-04-08 DIAGNOSIS — F17210 Nicotine dependence, cigarettes, uncomplicated: Secondary | ICD-10-CM | POA: Diagnosis not present

## 2016-04-08 DIAGNOSIS — Z79899 Other long term (current) drug therapy: Secondary | ICD-10-CM | POA: Diagnosis not present

## 2016-04-08 DIAGNOSIS — F319 Bipolar disorder, unspecified: Secondary | ICD-10-CM | POA: Diagnosis not present

## 2016-04-08 DIAGNOSIS — R1909 Other intra-abdominal and pelvic swelling, mass and lump: Secondary | ICD-10-CM | POA: Insufficient documentation

## 2016-04-08 DIAGNOSIS — Z8572 Personal history of non-Hodgkin lymphomas: Secondary | ICD-10-CM

## 2016-04-08 DIAGNOSIS — C8590 Non-Hodgkin lymphoma, unspecified, unspecified site: Secondary | ICD-10-CM

## 2016-04-08 NOTE — Progress Notes (Signed)
Complains of lump on her back that is concerning for her. Has history of lymphoma that was treated with chemo at [facility]. Pt states does not have follow up there anymore.

## 2016-04-12 ENCOUNTER — Ambulatory Visit: Admission: RE | Admit: 2016-04-12 | Payer: Medicaid Other | Source: Ambulatory Visit

## 2016-04-15 ENCOUNTER — Telehealth: Payer: Self-pay

## 2016-04-15 NOTE — Telephone Encounter (Signed)
Called patient since I saw that she had missed her MRI appointment. She stated that she totally missed it. Therefore, I gave the phone number [phone]) to reschedule it. I also told patient to give me a call back once she rescheduled in order for me to reschedule her appointment with Dr. Adonis Huguenin so he could review the results. Patient stated that she would call.

## 2016-04-17 ENCOUNTER — Ambulatory Visit: Payer: Self-pay | Admitting: General Surgery

## 2016-04-24 ENCOUNTER — Telehealth: Payer: Self-pay

## 2016-04-24 ENCOUNTER — Ambulatory Visit: Admission: RE | Admit: 2016-04-24 | Payer: Medicaid Other | Source: Ambulatory Visit

## 2016-04-24 NOTE — Telephone Encounter (Signed)
Called patient to let her know that her appointment with Dr. Adonis Huguenin was cancelled because she cancelled her MRI for the second time. Therefore, I told her that once she rescheduled her appointment for MRI, then she could reschedule her appointment with Dr. Adonis Huguenin.

## 2016-04-24 NOTE — Telephone Encounter (Signed)
Appointment was scheduled for 04/08/16 but was canceled by patient

## 2016-04-25 ENCOUNTER — Ambulatory Visit: Payer: Self-pay | Admitting: General Surgery

## 2017-01-06 IMAGING — CT CT ABD-PELV W/ CM
2 of 4 series · 15 of 46 positions shown, 17 images · IV contrast (omnipaque)
Comparison: CT the abdomen and pelvis 05/27/2014.

CLINICAL DATA: 39-year-old female with right lower quadrant
abdominal pain which began yesterday has some cramping around the
umbilicus. This morning, the patient awoke with pain radiating to
the right lower quadrant.

EXAM:
CT ABDOMEN AND PELVIS WITH CONTRAST
TECHNIQUE: Multidetector CT imaging of the abdomen and pelvis was performed
using the standard protocol following bolus administration of
intravenous contrast.
CONTRAST:  75mL OMNIPAQUE IOHEXOL 300 MG/ML  SOLN

[Series 2: routine abd pel with · axial · 0.73mm/px · z∈[-970,-606]mm · 12 of 87 slices shown, 14 images]
[im 7/87  soft-tissue]
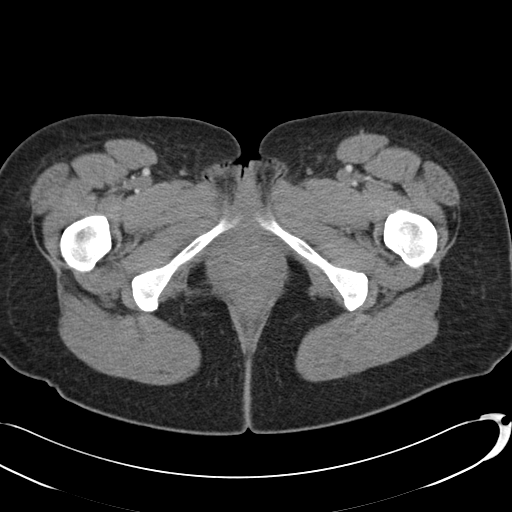
[im 7/87  bone]
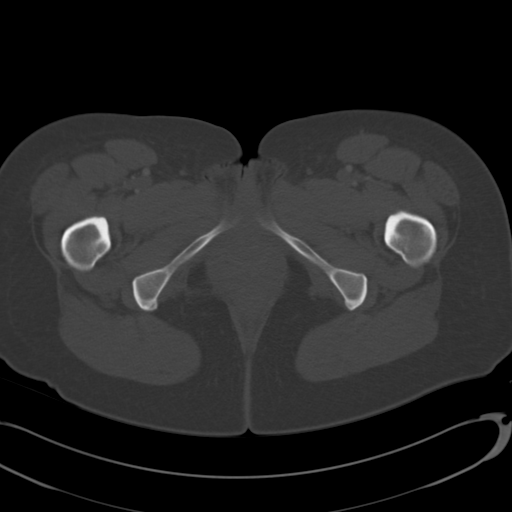
[im 14/87  soft-tissue]
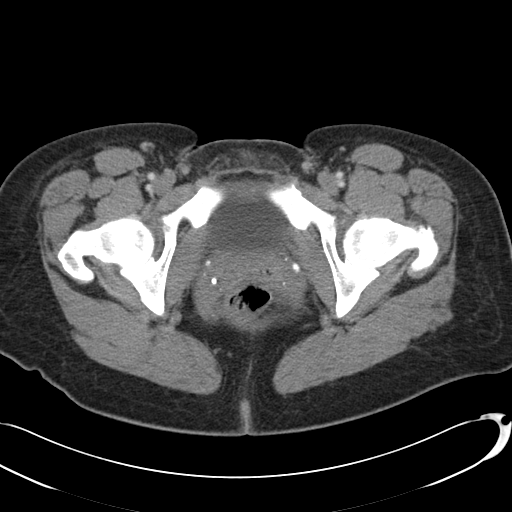
[im 20/87  soft-tissue]
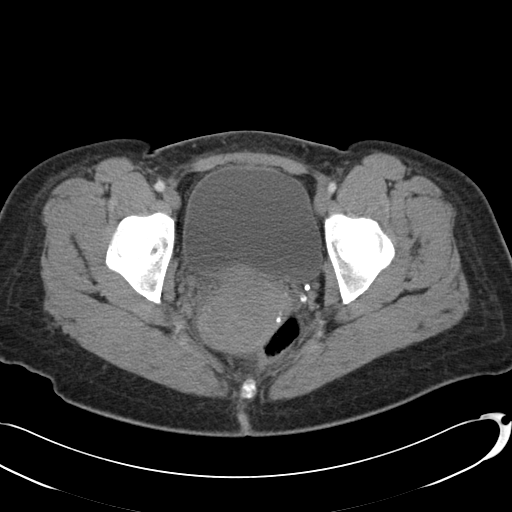
[im 27/87  soft-tissue]
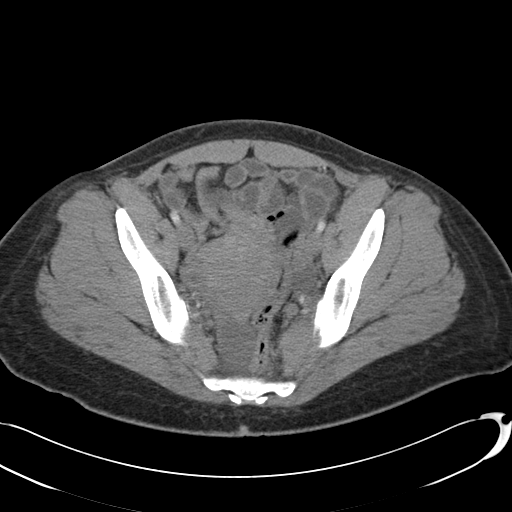
[im 34/87  soft-tissue]
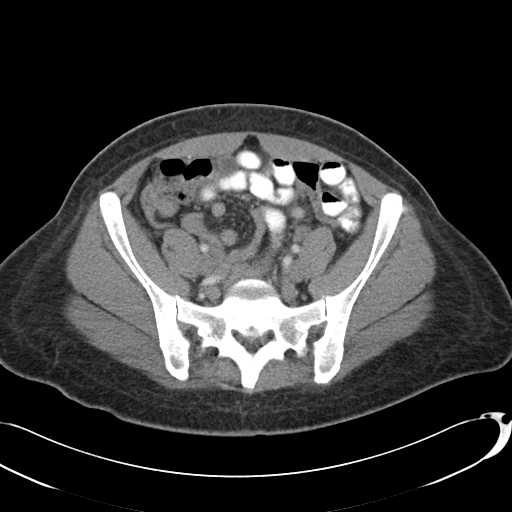
[im 40/87  soft-tissue]
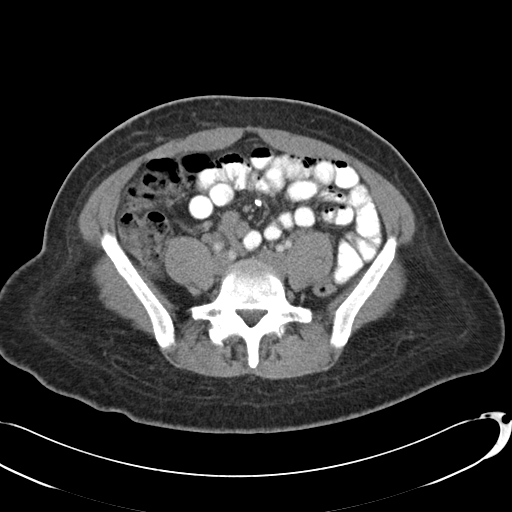
[im 47/87  soft-tissue]
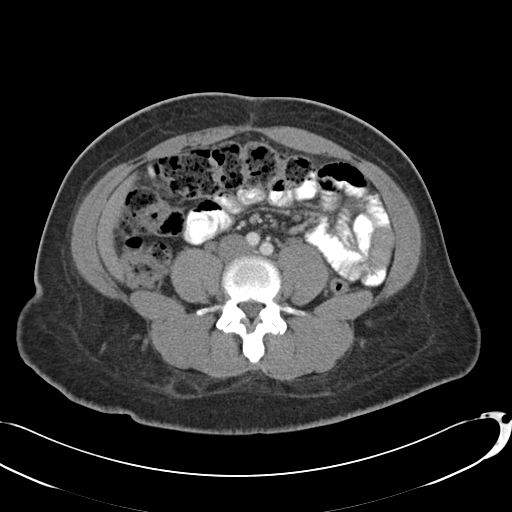
[im 53/87  soft-tissue]
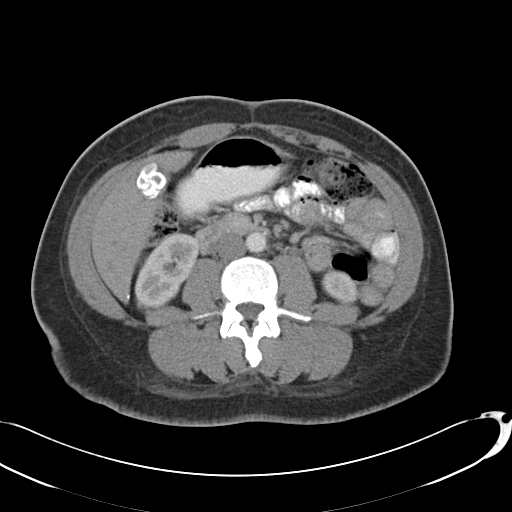
[im 60/87  soft-tissue]
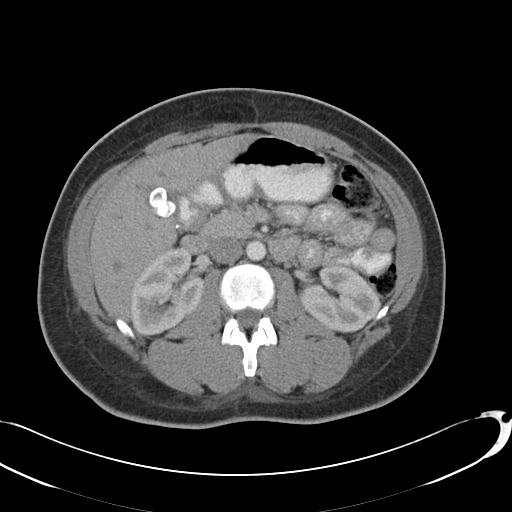
[im 60/87  bone]
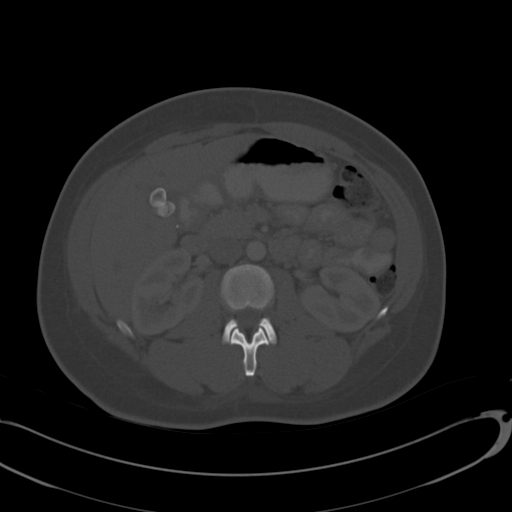
[im 67/87  soft-tissue]
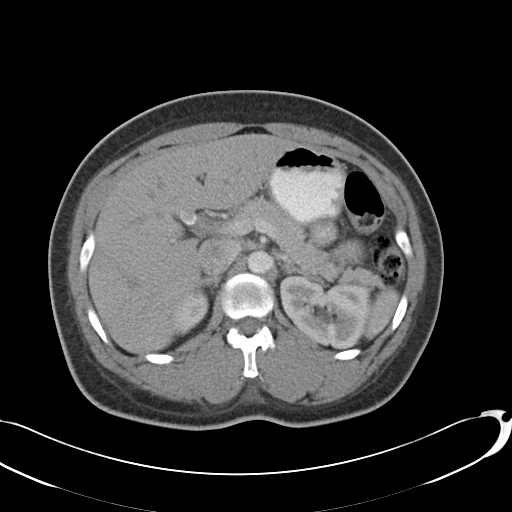
[im 73/87  soft-tissue]
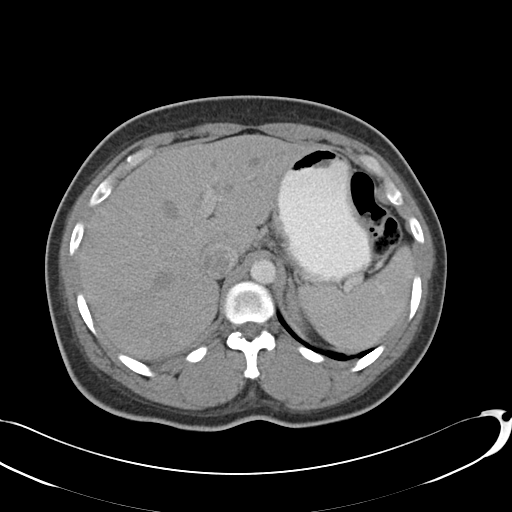
[im 80/87  soft-tissue]
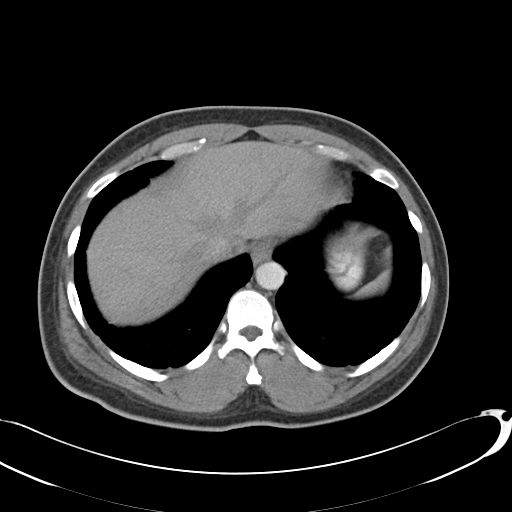

[Series 5: cor routine abd pel with · coronal · 0.62mm/px · 3 of 120 slices shown]
[im 40/120  soft-tissue]
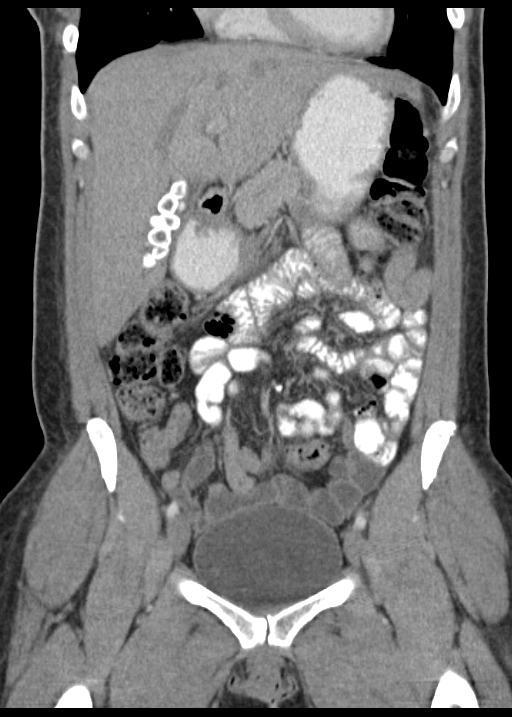
[im 53/120  soft-tissue]
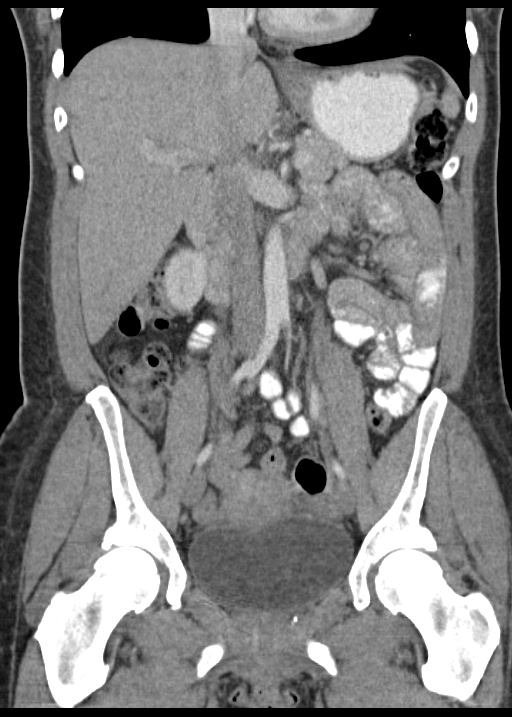
[im 67/120  soft-tissue]
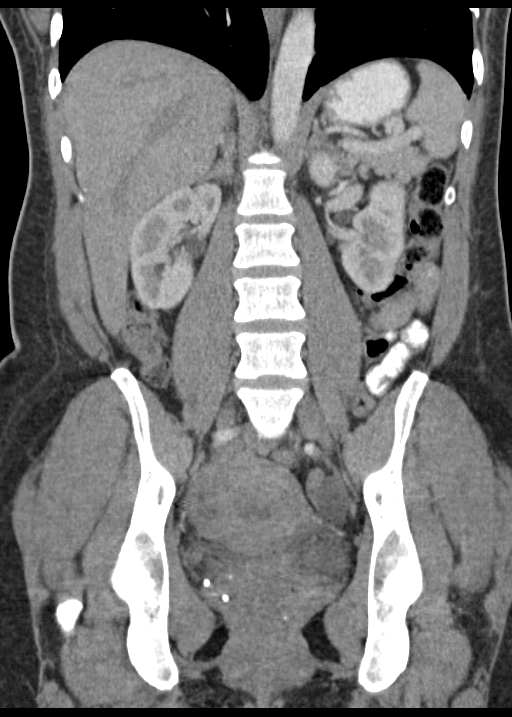

[15 of 46 positions shown; findings below may reference images not displayed]

FINDINGS: Lower chest: Minimal dependent subsegmental atelectasis or scarring
in the lower lobes of the lungs bilaterally.

Hepatobiliary: No cystic or solid hepatic lesions. No intra or
extrahepatic biliary ductal dilatation. Small calcified granulomas
in right lobe of the liver. Numerous densely calcified gallstones
filling the gallbladder. Gallbladder is nearly completely
decompressed around these calcified gallstones. No definite
pericholecystic fluid or surrounding inflammatory changes.

Pancreas: No pancreatic mass. No pancreatic ductal dilatation. No
pancreatic or peripancreatic fluid or inflammatory changes.

Spleen: Unremarkable.

Adrenals/Urinary Tract: Bilateral kidneys and bilateral adrenal
glands are normal in appearance. No hydroureteronephrosis. Urinary
bladder is normal in appearance.

Stomach/Bowel: Normal appearance of the stomach. No pathologic
dilatation of small bowel or colon. Normal appendix.

Vascular/Lymphatic: No significant atherosclerotic disease, aneurysm
or dissection identified in the abdominal or pelvic vasculature. No
lymphadenopathy noted in the abdomen or pelvis.

Reproductive: Uterus and ovaries are unremarkable in appearance.

Other: Small volume of free fluid in the cul-de-sac, small volume of
free fluid in the cul-de-sac, slightly greater than typically seen
for physeal tree fluid. No large volume of ascites. No
pneumoperitoneum.

Musculoskeletal: There are no aggressive appearing lytic or blastic
lesions noted in the visualized portions of the skeleton.
IMPRESSION: 1. Normal appendix.
2. Small volume of free fluid in the cul-de-sac. This is slightly
greater than typically seen in the setting of physiologic free
fluid, and could suggest recent rupture of an ovarian cyst.
3. No other potential acute findings are noted in the abdomen or
pelvis to account for the patient's symptoms.
4. However, patient does have numerous calcified gallstones. There
are no findings to suggest acute cholecystitis at this time.

## 2017-08-25 ENCOUNTER — Emergency Department: Payer: Self-pay

## 2017-08-25 ENCOUNTER — Emergency Department
Admission: EM | Admit: 2017-08-25 | Discharge: 2017-08-25 | Disposition: A | Discharge status: Home / Self Care | Payer: Self-pay | Attending: Emergency Medicine | Admitting: Emergency Medicine

## 2017-08-25 ENCOUNTER — Other Ambulatory Visit: Payer: Self-pay

## 2017-08-25 ENCOUNTER — Encounter: Payer: Self-pay | Admitting: Emergency Medicine

## 2017-08-25 DIAGNOSIS — B9689 Other specified bacterial agents as the cause of diseases classified elsewhere: Secondary | ICD-10-CM | POA: Insufficient documentation

## 2017-08-25 DIAGNOSIS — Z79899 Other long term (current) drug therapy: Secondary | ICD-10-CM | POA: Insufficient documentation

## 2017-08-25 DIAGNOSIS — F17210 Nicotine dependence, cigarettes, uncomplicated: Secondary | ICD-10-CM | POA: Insufficient documentation

## 2017-08-25 DIAGNOSIS — A599 Trichomoniasis, unspecified: Secondary | ICD-10-CM | POA: Insufficient documentation

## 2017-08-25 DIAGNOSIS — N760 Acute vaginitis: Secondary | ICD-10-CM | POA: Insufficient documentation

## 2017-08-25 DIAGNOSIS — Z8572 Personal history of non-Hodgkin lymphomas: Secondary | ICD-10-CM | POA: Insufficient documentation

## 2017-08-25 DIAGNOSIS — R102 Pelvic and perineal pain: Secondary | ICD-10-CM | POA: Insufficient documentation

## 2017-08-25 HISTORY — DX: Benign neoplasm of connective and other soft tissue, unspecified: D21.9

## 2017-08-25 LAB — CBC
HEMATOCRIT: 38.5 % (ref 35.0–47.0)
HEMOGLOBIN: 12.9 g/dL (ref 12.0–16.0)
MCH: 30.2 pg (ref 26.0–34.0)
MCHC: 33.5 g/dL (ref 32.0–36.0)
MCV: 90.2 fL (ref 80.0–100.0)
Platelets: 259 10*3/uL (ref 150–440)
RBC: 4.27 MIL/uL (ref 3.80–5.20)
RDW: 15.3 % — ABNORMAL HIGH (ref 11.5–14.5)
WBC: 5.9 10*3/uL (ref 3.6–11.0)

## 2017-08-25 LAB — COMPREHENSIVE METABOLIC PANEL
ALK PHOS: 43 U/L (ref 38–126)
ALT: 9 U/L (ref 0–44)
ANION GAP: 6 (ref 5–15)
AST: 14 U/L — ABNORMAL LOW (ref 15–41)
Albumin: 4.3 g/dL (ref 3.5–5.0)
BILIRUBIN TOTAL: 0.6 mg/dL (ref 0.3–1.2)
BUN: 11 mg/dL (ref 6–20)
CALCIUM: 9.3 mg/dL (ref 8.9–10.3)
CO2: 28 mmol/L (ref 22–32)
Chloride: 104 mmol/L (ref 98–111)
Creatinine, Ser: 0.75 mg/dL (ref 0.44–1.00)
GFR calc non Af Amer: >60 mL/min (ref >60)
Glucose, Bld: 101 mg/dL — ABNORMAL HIGH (ref 70–99)
POTASSIUM: 3.4 mmol/L — AB (ref 3.5–5.1)
SODIUM: 138 mmol/L (ref 135–145)
TOTAL PROTEIN: 8.2 g/dL — AB (ref 6.5–8.1)

## 2017-08-25 LAB — RAPID HIV SCREEN (HIV 1/2 AB+AG)
HIV 1/2 ANTIBODIES: NONREACTIVE
HIV-1 P24 Antigen - HIV24: NONREACTIVE

## 2017-08-25 LAB — URINALYSIS, COMPLETE (UACMP) WITH MICROSCOPIC
BILIRUBIN URINE: NEGATIVE
Bacteria, UA: NONE SEEN
GLUCOSE, UA: NEGATIVE mg/dL
HGB URINE DIPSTICK: NEGATIVE
KETONES UR: 20 mg/dL — AB
LEUKOCYTES UA: NEGATIVE
NITRITE: NEGATIVE
PROTEIN: NEGATIVE mg/dL
Specific Gravity, Urine: 1.029 (ref 1.005–1.030)
pH: 5 (ref 5.0–8.0)

## 2017-08-25 LAB — CHLAMYDIA/NGC RT PCR (ARMC ONLY)
CHLAMYDIA TR: NOT DETECTED
N GONORRHOEAE: NOT DETECTED

## 2017-08-25 LAB — WET PREP, GENITAL
Sperm: NONE SEEN
Yeast Wet Prep HPF POC: NONE SEEN

## 2017-08-25 LAB — POCT PREGNANCY, URINE: Preg Test, Ur: NEGATIVE

## 2017-08-25 MED ORDER — METRONIDAZOLE 500 MG PO TABS: 500.0000 mg | ORAL_TABLET | Freq: Two times a day (BID) | ORAL | 0 refills | Status: AC

## 2017-08-25 NOTE — Discharge Instructions (Addendum)
Please seek medical attention for any high fevers, chest pain, shortness of breath, change in behavior, persistent vomiting, bloody stool or any other new or concerning symptoms.  

## 2017-08-25 NOTE — ED Provider Notes (Signed)
[facility] Emergency Department Provider Note   ____________________________________________   I have reviewed the triage vital signs and the nursing notes.   HISTORY  Chief Complaint Abdominal Pain   History limited by: Not Limited   HPI Courtney Webb is a 42 y.o. female who presents to the emergency department today because of concern for right lower quadrant pain. The patient states that the pain started 2 days ago. Gradual onset. Has not changed locations. Some radiation to her groin. She has had similar pain in the past but it has always been related to her menstrual cycles. She however is not currently menstruating. Has had some abnormal vaginal discharge. Denies any issues with urination. Denies any fever.    Per medical record review patient has a history of fibroids, ectopic pregnancy.  Past Medical History:  Diagnosis Date  . Bipolar disorder ([place])   . Ectopic pregnancy   . Fibroids   . History of ectopic pregnancy 07/24/2012  . Incisional hernia, without obstruction or gangrene 04/02/2016  . Joint pain 12/02/2013  . 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 Marijuana use 07/24/2012  . Non Hodgkin's lymphoma ([place]) 2008  . Non Hodgkin's lymphoma ([place])   . Pelvic pain 01/01/2013  . Soft tissue mass 04/02/2016    Patient Active Problem List   Diagnosis Date Noted  . Soft tissue mass 04/02/2016  . Incisional hernia, without obstruction or gangrene 04/02/2016  . 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 Joint pain 12/02/2013  . Pelvic pain 01/01/2013  . History of ectopic pregnancy 07/24/2012  . Marijuana use 07/24/2012    Past Surgical History:  Procedure Laterality Date  . ABDOMINAL SURGERY  2011   Left Salpingectomy-Ectopic Pregnancy 2011 and 2012  . EXPLORATORY LAPAROTOMY  2000   possible bowel esection and partial resection  . PORTACATH PLACEMENT  2008   Lymphoma, non-Hodgkin's    Prior to Admission medications    Medication Sig Start Date End Date Taking? Authorizing Provider  pantoprazole (PROTONIX) 40 MG tablet Take 1 tablet (40 mg total) by mouth daily. 04/02/16   Clayburn Pert, MD    Allergies Patient has no known allergies.  Family History  Problem Relation Age of Onset  . Other Sister        [place]  . Hypertension Maternal Aunt   . COPD Father     Social History Social History   Tobacco Use  . Smoking status: Current Every Day Smoker    Packs/day: 0.50    Years: 20.00    Pack years: 10.00    Types: Cigarettes  . Smokeless tobacco: Never Used  Substance Use Topics  . Alcohol use: No  . Drug use: Yes    Types: Marijuana    Comment: lasts uses 02/2016    Review of Systems Constitutional: No fever/chills Eyes: No visual changes. ENT: No sore throat. Cardiovascular: Denies chest pain. Respiratory: Denies shortness of breath. Gastrointestinal: Positive for right lower quadrant pain.  Genitourinary: Negative for dysuria. Musculoskeletal: Negative for back pain. Skin: Negative for rash. Neurological: Negative for headaches, focal weakness or numbness.  ____________________________________________   PHYSICAL EXAM:  VITAL SIGNS: ED Triage Vitals [08/25/17 1457]  Enc Vitals Group     BP (!) 157/101     Pulse Rate 75     Resp 16     Temp 98.3 F (36.8 C)     Temp Source Oral     SpO2 99 %     Weight 160 lb (72.6 kg)  Height 5\' 3"  (1.6 m)     Head Circumference      Peak Flow      Pain Score 7   Constitutional: Alert and oriented.  Eyes: Conjunctivae are normal.  ENT      Head: Normocephalic and atraumatic.      Nose: No congestion/rhinnorhea.      Mouth/Throat: Mucous membranes are moist.      Neck: No stridor. Hematological/Lymphatic/Immunilogical: No cervical lymphadenopathy. Cardiovascular: Normal rate, regular rhythm.  No murmurs, rubs, or gallops. Respiratory: Normal respiratory effort without tachypnea nor retractions. Breath sounds are  clear and equal bilaterally. No wheezes/rales/rhonchi. Gastrointestinal: Soft and tender to palpation in the right lower quadrant. No rebound. No guarding.  Genitourinary: Moderate amount of white discharge in vaginal vault. No CMT. No adnexal fullness, mild adnexal tenderness on the right side. Musculoskeletal: Normal range of motion in all extremities. No lower extremity edema. Neurologic:  Normal speech and language. No gross focal neurologic deficits are appreciated.  Skin:  Skin is warm, dry and intact. No rash noted. Psychiatric: Mood and affect are normal. Speech and behavior are normal. Patient exhibits appropriate insight and judgment.  ____________________________________________    LABS (pertinent positives/negatives)  Upreg neg CMP na 138, k 3.4, glu 101, cr 0.75 CBC wbc 5.9, hgb 12.9, plt 259 UA clear, ketones 20, rbc and wbc 0-5 Wet prep positive trich, positive clue cells Negative chlamydia and gonorrhoeae ____________________________________________   EKG  None  ____________________________________________    RADIOLOGY  [place] Fibroid   ____________________________________________   PROCEDURES  Procedures  ____________________________________________   INITIAL IMPRESSION / ASSESSMENT AND PLAN / ED COURSE  Pertinent labs & imaging results that were available during my care of the patient were reviewed by me and considered in my medical decision making (see chart for details).   Patient presented to the emergency department today because of concerns for right adnexal pain.  Work-up was positive for both Trichomonas and bacterial vaginosis.  Ultrasound did not show any concerning TOA or torsion.  Patient did not have any CMT on pelvic exam.  Will treat for trichomonas and BV.  Discussed sexually transmitted nature of trichomonas and that sexual partners will also have to be treated.  Discussed importance of patient following up to get full sexual transmitted  disease panel.  ____________________________________________   FINAL CLINICAL IMPRESSION(S) / ED DIAGNOSES  Final diagnoses:  Adnexal pain  Trichimoniasis  Bacterial vaginosis     Note: This dictation was prepared with Dragon dictation. Any transcriptional errors that result from this process are unintentional     Nance Pear, MD 08/25/17 [phone]

## 2017-08-25 NOTE — ED Notes (Signed)
ED Provider at bedside. 

## 2017-08-25 NOTE — ED Notes (Signed)
Patient transported to Ultrasound 

## 2017-08-25 NOTE — ED Triage Notes (Signed)
Says low abd pain esp on right.  3 days.  Stabbing pain.  Says she has had same pain and told she has fibroids or cysts.  Has been to gyn and has to schedule a scraping procedure.  No vaginal discharge.

## 2017-10-29 ENCOUNTER — Emergency Department
Admission: EM | Admit: 2017-10-29 | Discharge: 2017-10-29 | Disposition: A | Discharge status: Home / Self Care | Payer: Medicaid Other | Attending: Emergency Medicine | Admitting: Emergency Medicine

## 2017-10-29 ENCOUNTER — Encounter: Payer: Self-pay | Admitting: Emergency Medicine

## 2017-10-29 DIAGNOSIS — F17210 Nicotine dependence, cigarettes, uncomplicated: Secondary | ICD-10-CM | POA: Insufficient documentation

## 2017-10-29 DIAGNOSIS — R030 Elevated blood-pressure reading, without diagnosis of hypertension: Secondary | ICD-10-CM

## 2017-10-29 DIAGNOSIS — Z8572 Personal history of non-Hodgkin lymphomas: Secondary | ICD-10-CM | POA: Insufficient documentation

## 2017-10-29 DIAGNOSIS — F319 Bipolar disorder, unspecified: Secondary | ICD-10-CM | POA: Insufficient documentation

## 2017-10-29 DIAGNOSIS — N760 Acute vaginitis: Secondary | ICD-10-CM | POA: Insufficient documentation

## 2017-10-29 DIAGNOSIS — B9689 Other specified bacterial agents as the cause of diseases classified elsewhere: Secondary | ICD-10-CM

## 2017-10-29 DIAGNOSIS — F1290 Cannabis use, unspecified, uncomplicated: Secondary | ICD-10-CM | POA: Insufficient documentation

## 2017-10-29 DIAGNOSIS — Z79899 Other long term (current) drug therapy: Secondary | ICD-10-CM | POA: Insufficient documentation

## 2017-10-29 LAB — URINALYSIS, COMPLETE (UACMP) WITH MICROSCOPIC
Bacteria, UA: NONE SEEN
Bilirubin Urine: NEGATIVE
GLUCOSE, UA: NEGATIVE mg/dL
Hgb urine dipstick: NEGATIVE
KETONES UR: 5 mg/dL — AB
Leukocytes, UA: NEGATIVE
Nitrite: NEGATIVE
PH: 6 (ref 5.0–8.0)
Protein, ur: NEGATIVE mg/dL
SPECIFIC GRAVITY, URINE: 1.025 (ref 1.005–1.030)

## 2017-10-29 LAB — POCT PREGNANCY, URINE: PREG TEST UR: NEGATIVE

## 2017-10-29 LAB — WET PREP, GENITAL
SPERM: NONE SEEN
Trich, Wet Prep: NONE SEEN
YEAST WET PREP: NONE SEEN

## 2017-10-29 LAB — CHLAMYDIA/NGC RT PCR (ARMC ONLY)
CHLAMYDIA TR: NOT DETECTED
N gonorrhoeae: NOT DETECTED

## 2017-10-29 MED ORDER — METRONIDAZOLE 500 MG PO TABS: 500.0000 mg | ORAL_TABLET | Freq: Two times a day (BID) | ORAL | 0 refills | Status: DC

## 2017-10-29 NOTE — Discharge Instructions (Addendum)
Follow-up with the health department if any continued vaginal problems.  Begin taking Flagyl 500 mg twice daily for 7 days.  Also have your blood pressure rechecked as it was elevated in the emergency department.  Blood pressure was 139/102 in triage.

## 2017-10-29 NOTE — ED Notes (Signed)
Pt observed resting in bed with NAD.

## 2017-10-29 NOTE — ED Provider Notes (Signed)
[facility] Emergency Department Provider Note  ____________________________________________   First MD Initiated Contact with Patient 10/29/17 [phone]     (approximate)  I have reviewed the triage vital signs and the nursing notes.   HISTORY  Chief Complaint Pelvic Pain and Vaginal Discharge   HPI Courtney Webb is a 42 y.o. female presents to the ED with complaint of vaginal discharge for 3 months.  Patient states that there is been some itching.  She currently is sexually active and states that she does not use protection "all the time".  She denies any nausea, vomiting, fever or chills.  Past Medical History:  Diagnosis Date  . Bipolar disorder ([place])   . Ectopic pregnancy   . Fibroids   . History of ectopic pregnancy 07/24/2012  . Incisional hernia, without obstruction or gangrene 04/02/2016  . Joint pain 12/02/2013  . 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 Marijuana use 07/24/2012  . Non Hodgkin's lymphoma ([place]) 2008  . Non Hodgkin's lymphoma ([place])   . Pelvic pain 01/01/2013  . Soft tissue mass 04/02/2016    Patient Active Problem List   Diagnosis Date Noted  . Soft tissue mass 04/02/2016  . Incisional hernia, without obstruction or gangrene 04/02/2016  . 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 Joint pain 12/02/2013  . Pelvic pain 01/01/2013  . History of ectopic pregnancy 07/24/2012  . Marijuana use 07/24/2012    Past Surgical History:  Procedure Laterality Date  . ABDOMINAL SURGERY  2011   Left Salpingectomy-Ectopic Pregnancy 2011 and 2012  . EXPLORATORY LAPAROTOMY  2000   possible bowel esection and partial resection  . PORTACATH PLACEMENT  2008   Lymphoma, non-Hodgkin's    Prior to Admission medications   Medication Sig Start Date End Date Taking? Authorizing Provider  metroNIDAZOLE (FLAGYL) 500 MG tablet Take 1 tablet (500 mg total) by mouth 2 (two) times daily. 10/29/17   Johnn Hai, PA-C  pantoprazole  (PROTONIX) 40 MG tablet Take 1 tablet (40 mg total) by mouth daily. 04/02/16   Clayburn Pert, MD    Allergies Patient has no known allergies.  Family History  Problem Relation Age of Onset  . Other Sister        [place]  . Hypertension Maternal Aunt   . COPD Father     Social History Social History   Tobacco Use  . Smoking status: Current Every Day Smoker    Packs/day: 0.50    Years: 20.00    Pack years: 10.00    Types: Cigarettes  . Smokeless tobacco: Never Used  Substance Use Topics  . Alcohol use: No  . Drug use: Yes    Types: Marijuana    Comment: lasts uses 02/2016    Review of Systems Constitutional: No fever/chills Cardiovascular: Denies chest pain. Respiratory: Denies shortness of breath. Gastrointestinal: No abdominal pain.  No nausea, no vomiting.  No diarrhea.   Genitourinary: Negative for dysuria.  Positive for vaginal discharge. Musculoskeletal: Negative for back pain. Skin: Negative for rash. Neurological: Negative for headaches, focal weakness or numbness. ____________________________________________   PHYSICAL EXAM:  VITAL SIGNS: ED Triage Vitals [10/29/17 0909]  Enc Vitals Group     BP (!) 139/102     Pulse Rate 68     Resp 20     Temp 98.1 F (36.7 C)     Temp Source Oral     SpO2 100 %     Weight 160 lb (72.6 kg)     Height 5'  3" (1.6 m)     Head Circumference      Peak Flow      Pain Score 8     Pain Loc      Pain Edu?      Excl. in [place]?    Constitutional: Alert and oriented. Well appearing and in no acute distress. Eyes: Conjunctivae are normal.  Head: Atraumatic. Nose: No congestion/rhinnorhea. Mouth/Throat: Mucous membranes are moist.  Oropharynx non-erythematous. Neck: No stridor.   Cardiovascular: Normal rate, regular rhythm. Grossly normal heart sounds.  Good peripheral circulation. Respiratory: Normal respiratory effort.  No retractions. Lungs CTAB. Gastrointestinal: Soft and nontender. No  distention. Genitourinary: External genitalia is unremarkable.  There is a thick white-yellowish discharge present along the vaginal walls and vault.  Cervix is unremarkable.  There is no chandelier sign or cervical motion tenderness.  No adnexal masses or tenderness present. Musculoskeletal: Moves upper and lower extremities that any difficulty. Neurologic:  Normal speech and language. No gross focal neurologic deficits are appreciated. No gait instability. Skin:  Skin is warm, dry and intact. No rash noted. Psychiatric: Mood and affect are normal. Speech and behavior are normal.  ____________________________________________   LABS (all labs ordered are listed, but only abnormal results are displayed)  Labs Reviewed  WET PREP, GENITAL - Abnormal; Notable for the following components:      Result Value   Clue Cells Wet Prep HPF POC PRESENT (*)    WBC, Wet Prep HPF POC FEW (*)    All other components within normal limits  URINALYSIS, COMPLETE (UACMP) WITH MICROSCOPIC - Abnormal; Notable for the following components:   Color, Urine YELLOW (*)    APPearance CLEAR (*)    Ketones, ur 5 (*)    All other components within normal limits  CHLAMYDIA/NGC RT PCR ([facility] ONLY)  POC URINE PREG, ED  POCT PREGNANCY, URINE   PROCEDURES  Procedure(s) performed: None  Procedures  Critical Care performed: No  ____________________________________________   INITIAL IMPRESSION / ASSESSMENT AND PLAN / ED COURSE  As part of my medical decision making, I reviewed the following data within the electronic MEDICAL RECORD NUMBER Notes from prior ED visits and [place] Controlled Substance Database  She presents to the ED with complaint of vaginal discharge for 3 months.  She denies any fever or chills.  Physical exam was benign.  Wet prep shows clue cells.  Patient was made aware that she has bacterial vaginosis.  Patient was given a prescription for Flagyl 500 mg twice daily for 7 days.  Also she is to  follow-up with her PCP or the health department to have her blood pressure rechecked initially her blood pressure was elevated however prior to discharge her blood pressure was 127/82.  ____________________________________________   FINAL CLINICAL IMPRESSION(S) / ED DIAGNOSES  Final diagnoses:  BV (bacterial vaginosis)  Elevated blood pressure reading     ED Discharge Orders         Ordered    metroNIDAZOLE (FLAGYL) 500 MG tablet  2 times daily     10/29/17 1112           Note:  This document was prepared using Dragon voice recognition software and may include unintentional dictation errors.    Johnn Hai, PA-C 10/29/17 1246    Schuyler Amor, MD 10/29/17 [phone]

## 2017-10-29 NOTE — ED Triage Notes (Signed)
Pt reports pain to her pelvic region and vaginal discharge for about 3 months. Pt reports her cycles are not normal either. Pt states vaginal discharge is a lot and is white in color. Pt reports is sexually active and admits that she does not use protection all the time.

## 2018-02-09 ENCOUNTER — Other Ambulatory Visit: Payer: Self-pay

## 2018-02-09 ENCOUNTER — Emergency Department: Payer: No Typology Code available for payment source

## 2018-02-09 ENCOUNTER — Encounter: Payer: Self-pay | Admitting: Emergency Medicine

## 2018-02-09 ENCOUNTER — Emergency Department
Admission: EM | Admit: 2018-02-09 | Discharge: 2018-02-09 | Disposition: A | Discharge status: Home / Self Care | Payer: No Typology Code available for payment source | Attending: Student in an Organized Health Care Education/Training Program | Admitting: Student in an Organized Health Care Education/Training Program

## 2018-02-09 DIAGNOSIS — M549 Dorsalgia, unspecified: Secondary | ICD-10-CM | POA: Insufficient documentation

## 2018-02-09 DIAGNOSIS — F17210 Nicotine dependence, cigarettes, uncomplicated: Secondary | ICD-10-CM | POA: Insufficient documentation

## 2018-02-09 DIAGNOSIS — Z79899 Other long term (current) drug therapy: Secondary | ICD-10-CM | POA: Diagnosis not present

## 2018-02-09 DIAGNOSIS — M25571 Pain in right ankle and joints of right foot: Secondary | ICD-10-CM | POA: Insufficient documentation

## 2018-02-09 MED ORDER — IBUPROFEN 600 MG PO TABS
600.0000 mg | ORAL_TABLET | Freq: Once | ORAL | Status: AC
  Administered 2018-02-09: 600 mg via ORAL
  Filled 2018-02-09: qty 1

## 2018-02-09 MED ORDER — CYCLOBENZAPRINE HCL 10 MG PO TABS: 10.0000 mg | ORAL_TABLET | Freq: Three times a day (TID) | ORAL | 0 refills | Status: DC | PRN

## 2018-02-09 MED ORDER — TRAMADOL HCL 50 MG PO TABS
50.0000 mg | ORAL_TABLET | Freq: Once | ORAL | Status: AC
  Administered 2018-02-09: 50 mg via ORAL
  Filled 2018-02-09: qty 1

## 2018-02-09 MED ORDER — TRAMADOL HCL 50 MG PO TABS: 50.0000 mg | ORAL_TABLET | Freq: Two times a day (BID) | ORAL | 0 refills | Status: DC | PRN

## 2018-02-09 MED ORDER — CYCLOBENZAPRINE HCL 10 MG PO TABS
10.0000 mg | ORAL_TABLET | Freq: Once | ORAL | Status: AC
  Administered 2018-02-09: 10 mg via ORAL
  Filled 2018-02-09: qty 1

## 2018-02-09 MED ORDER — IBUPROFEN 600 MG PO TABS: 600.0000 mg | ORAL_TABLET | Freq: Three times a day (TID) | ORAL | 0 refills | Status: DC | PRN
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
## 2018-10-20 ENCOUNTER — Emergency Department (HOSPITAL_COMMUNITY): Payer: Medicaid Other

## 2018-10-20 ENCOUNTER — Encounter (HOSPITAL_COMMUNITY): Payer: Self-pay | Admitting: Emergency Medicine

## 2018-10-20 ENCOUNTER — Emergency Department (HOSPITAL_COMMUNITY)
Admission: EM | Admit: 2018-10-20 | Discharge: 2018-10-20 | Disposition: A | Discharge status: Home / Self Care | Payer: Medicaid Other | Attending: Emergency Medicine | Admitting: Emergency Medicine

## 2018-10-20 ENCOUNTER — Other Ambulatory Visit: Payer: Self-pay

## 2018-10-20 DIAGNOSIS — R109 Unspecified abdominal pain: Secondary | ICD-10-CM | POA: Insufficient documentation

## 2018-10-20 DIAGNOSIS — Z79899 Other long term (current) drug therapy: Secondary | ICD-10-CM | POA: Diagnosis not present

## 2018-10-20 DIAGNOSIS — F17210 Nicotine dependence, cigarettes, uncomplicated: Secondary | ICD-10-CM | POA: Insufficient documentation

## 2018-10-20 DIAGNOSIS — Z8572 Personal history of non-Hodgkin lymphomas: Secondary | ICD-10-CM | POA: Diagnosis not present

## 2018-10-20 LAB — CBC
HCT: 40 % (ref 36.0–46.0)
Hemoglobin: 12.7 g/dL (ref 12.0–15.0)
MCH: 29.7 pg (ref 26.0–34.0)
MCHC: 31.8 g/dL (ref 30.0–36.0)
MCV: 93.5 fL (ref 80.0–100.0)
Platelets: 256 10*3/uL (ref 150–400)
RBC: 4.28 MIL/uL (ref 3.87–5.11)
RDW: 17.7 % — ABNORMAL HIGH (ref 11.5–15.5)
WBC: 8.4 10*3/uL (ref 4.0–10.5)
nRBC: 0 % (ref 0.0–0.2)

## 2018-10-20 LAB — BASIC METABOLIC PANEL
Anion gap: 9 (ref 5–15)
BUN: 9 mg/dL (ref 6–20)
CO2: 29 mmol/L (ref 22–32)
Calcium: 9.4 mg/dL (ref 8.9–10.3)
Chloride: 103 mmol/L (ref 98–111)
Creatinine, Ser: 0.83 mg/dL (ref 0.44–1.00)
GFR calc Af Amer: >60 mL/min (ref >60)
GFR calc non Af Amer: >60 mL/min (ref >60)
Glucose, Bld: 87 mg/dL (ref 70–99)
Potassium: 3.6 mmol/L (ref 3.5–5.1)
Sodium: 141 mmol/L (ref 135–145)

## 2018-10-20 LAB — URINALYSIS, ROUTINE W REFLEX MICROSCOPIC
Bilirubin Urine: NEGATIVE
Glucose, UA: NEGATIVE mg/dL
Hgb urine dipstick: NEGATIVE
Ketones, ur: NEGATIVE mg/dL
Leukocytes,Ua: NEGATIVE
Nitrite: NEGATIVE
Protein, ur: NEGATIVE mg/dL
Specific Gravity, Urine: 1.026 (ref 1.005–1.030)
pH: 5 (ref 5.0–8.0)

## 2018-10-20 LAB — I-STAT BETA HCG BLOOD, ED (MC, WL, AP ONLY): I-stat hCG, quantitative: <5 m[IU]/mL (ref <5)

## 2018-10-20 LAB — LIPASE, BLOOD: Lipase: 47 U/L (ref 11–51)

## 2018-10-20 MED ORDER — IOHEXOL 300 MG/ML  SOLN
100.0000 mL | Freq: Once | INTRAMUSCULAR | Status: AC | PRN
  Administered 2018-10-20: 100 mL via INTRAVENOUS

## 2018-10-20 MED ORDER — IBUPROFEN 800 MG PO TABS: 800.0000 mg | ORAL_TABLET | Freq: Three times a day (TID) | ORAL | 0 refills | Status: AC

## 2018-10-20 MED ORDER — METHOCARBAMOL 500 MG PO TABS: 500.0000 mg | ORAL_TABLET | Freq: Two times a day (BID) | ORAL | 0 refills | Status: AC

## 2018-10-20 NOTE — ED Provider Notes (Signed)
[facility] EMERGENCY DEPARTMENT Provider Note   CSN: [id number] Arrival date & time: 10/20/18  1646     History   Chief Complaint Chief Complaint  Patient presents with   Flank Pain    HPI Courtney Webb is a 43 y.o. female.     The history is provided by the patient.  Flank Pain This is a new problem. Episode onset: 3 days. The problem occurs constantly. The problem has been gradually worsening. Associated symptoms include abdominal pain. Nothing aggravates the symptoms. Nothing relieves the symptoms. She has tried nothing for the symptoms. The treatment provided no relief.  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[place]) 12/13/2013   Marijuana use 07/24/2012   Non Hodgkin's lymphoma ([place]) 2008   Non Hodgkin's lymphoma ([place])    Pelvic pain 01/01/2013   Soft tissue mass 04/02/2016    Patient Active Problem List   Diagnosis Date Noted   Soft tissue mass 04/02/2016   Incisional hernia, without obstruction or gangrene 04/02/2016   Lymphoma, 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      Home Medications    Prior to Admission medications   Medication Sig Start Date End Date Taking? Authorizing Provider    cyclobenzaprine (FLEXERIL) 10 MG tablet Take 1 tablet (10 mg total) by mouth 3 (three) times daily as needed. 02/09/18   Sable Feil, PA-C  ibuprofen (ADVIL,MOTRIN) 600 MG tablet Take 1 tablet (600 mg total) by mouth every 8 (eight) hours as needed. 02/09/18   Sable Feil, PA-C  metroNIDAZOLE (FLAGYL) 500 MG tablet Take 1 tablet (500 mg total) by mouth 2 (two) times daily. 10/29/17   Johnn Hai, PA-C  pantoprazole (PROTONIX) 40 MG tablet Take 1 tablet (40 mg total) by mouth daily. 04/02/16   Clayburn Pert, MD  traMADol (ULTRAM) 50 MG tablet Take 1 tablet (50 mg total) by mouth every 12 (twelve) hours as needed. 02/09/18   Sable Feil, PA-C    Family History Family History  Problem Relation Age of Onset   Other Sister        Stillbirths   Hypertension Maternal Aunt    COPD Father     Social History Social History   Tobacco Use   Smoking status: Current Every Day Smoker    Packs/day: 0.50    Years: 20.00    Pack years: 10.00    Types: Cigarettes   Smokeless tobacco: Never Used  Substance Use Topics   Alcohol use: No   Drug use: Yes    Types: Marijuana    Comment: lasts uses 02/2016     Allergies   Patient has no known allergies.   Review of Systems Review of  Systems  Gastrointestinal: Positive for abdominal pain.  Genitourinary: Positive for flank pain.  All other systems reviewed and are negative.    Physical Exam Updated Vital Signs BP (!) 160/91    Pulse 73    Temp 98.6 F (37 C) (Oral)    Resp 18    Ht 5\' 3"  (1.6 m)    Wt 68 kg    SpO2 100%    BMI 26.57 kg/m   Physical Exam Vitals signs and nursing note reviewed.  Constitutional:      Appearance: She is well-developed.  HENT:     Head: Normocephalic.  Neck:     Musculoskeletal: Normal range of motion.  Cardiovascular:     Rate and Rhythm: Normal rate and regular rhythm.  Pulmonary:     Effort: Pulmonary effort is normal.  Abdominal:     General: Abdomen is flat. There is no  distension.     Tenderness: There is abdominal tenderness.     Comments: Tender right lower abdomen diffusely   Musculoskeletal: Normal range of motion.  Skin:    General: Skin is warm.  Neurological:     Mental Status: She is alert and oriented to person, place, and time.  Psychiatric:        Mood and Affect: Mood normal.      ED Treatments / Results  Labs (all labs ordered are listed, but only abnormal results are displayed) Labs Reviewed  CBC - Abnormal; Notable for the following components:      Result Value   RDW 17.7 (*)    All other components within normal limits  URINALYSIS, ROUTINE W REFLEX MICROSCOPIC  BASIC METABOLIC PANEL  LIPASE, BLOOD  I-STAT BETA HCG BLOOD, ED ([facility], [facility], [facility] ONLY)    EKG None  Radiology Ct Abdomen Pelvis W Contrast  Result Date: 10/20/2018 CLINICAL DATA:  Right lower quadrant and flank pain for the past 3 days. EXAM: CT ABDOMEN AND PELVIS WITH CONTRAST TECHNIQUE: Multidetector CT imaging of the abdomen and pelvis was performed using the standard protocol following bolus administration of intravenous contrast. CONTRAST:  174mL OMNIPAQUE IOHEXOL 300 MG/ML  SOLN COMPARISON:  CT abdomen pelvis dated March 18, 2016. FINDINGS: Lower chest: No acute abnormality. Hepatobiliary: No focal liver abnormality. Unchanged cholelithiasis. No gallbladder wall thickening or biliary dilatation. Pancreas: Unremarkable. No pancreatic ductal dilatation or surrounding inflammatory changes. Spleen: Normal in size without focal abnormality. Adrenals/Urinary Tract: Adrenal glands are unremarkable. Kidneys are normal, without renal calculi, focal lesion, or hydronephrosis. Bladder is unremarkable. Stomach/Bowel: Stomach is within normal limits. Appendix appears normal. No evidence of bowel wall thickening, distention, or inflammatory changes. Vascular/Lymphatic: No significant vascular findings are present. No enlarged abdominal or pelvic lymph nodes. Reproductive: Uterus and  bilateral adnexa are unremarkable. Other: Trace free fluid in the pelvis, likely physiologic. No pneumoperitoneum. Unchanged fat containing supraumbilical ventral hernia. Musculoskeletal: No acute or significant osseous findings. Chronic sclerosis involving the left greater than right iliac aspect of the sacroiliac joints. Mild bilateral hip osteoarthritis. IMPRESSION: 1.  No acute intra-abdominal process.  Normal appendix. 2. Unchanged cholelithiasis. Electronically Signed   By: Titus Dubin M.D.   On: 10/20/2018 20:29    Procedures Procedures (including critical care time)  Medications Ordered in ED Medications  iohexol (OMNIPAQUE) 300 MG/ML solution 100 mL (100 mLs Intravenous Contrast Given 10/20/18 1955)     Initial Impression / Assessment and Plan / ED Course  I have reviewed the triage vital signs and the nursing notes.  Pertinent labs &  imaging results that were available during my care of the patient were reviewed by me and considered in my medical decision making (see chart for details).        MDM  Pt denies vaginal complaint, no std concerns.  No renal stone on ct.  No bowel inflammation.  I will treat pt for muscular pain  Rx for robaxin and Voltaren   Final Clinical Impressions(s) / ED Diagnoses   Final diagnoses:  Right flank pain    ED Discharge Orders         Ordered    methocarbamol (ROBAXIN) 500 MG tablet  2 times daily     10/20/18 2056    ibuprofen (ADVIL) 800 MG tablet  3 times daily     10/20/18 2056        An After Visit Summary was printed and given to the patient.    Fransico Meadow, PA-C 10/20/18 2056    Deno Etienne, DO 10/20/18 2100

## 2018-10-20 NOTE — Discharge Instructions (Addendum)
Return if any problems.

## 2018-10-20 NOTE — ED Triage Notes (Signed)
Pt reports\ RLQ pain and flank pain for 3 days. Reports last normal period was March or April and last bleeding was August. Has taken negative pregnancy tests. Denies N/V.
